# Patient Record
Sex: Male | Born: 1970
Health system: Southern US, Community
[De-identification: ages and names within clinical notes are randomized; demographics above are authoritative.]

## PROBLEM LIST (undated history)

## (undated) DIAGNOSIS — G8929 Other chronic pain: Secondary | ICD-10-CM

## (undated) DIAGNOSIS — M549 Dorsalgia, unspecified: Secondary | ICD-10-CM

## (undated) DIAGNOSIS — G4730 Sleep apnea, unspecified: Secondary | ICD-10-CM

## (undated) DIAGNOSIS — J189 Pneumonia, unspecified organism: Secondary | ICD-10-CM

## (undated) DIAGNOSIS — K219 Gastro-esophageal reflux disease without esophagitis: Secondary | ICD-10-CM

## (undated) DIAGNOSIS — R011 Cardiac murmur, unspecified: Secondary | ICD-10-CM

## (undated) DIAGNOSIS — I370 Nonrheumatic pulmonary valve stenosis: Secondary | ICD-10-CM

## (undated) DIAGNOSIS — U071 COVID-19: Secondary | ICD-10-CM

## (undated) HISTORY — PX: ESOPHAGOGASTRODUODENOSCOPY: SHX1529

## (undated) HISTORY — DX: Nonrheumatic pulmonary valve stenosis: I37.0

## (undated) HISTORY — DX: COVID-19: U07.1

## (undated) HISTORY — PX: EXTENSOR TENDON OF FOREARM / WRIST REPAIR: SHX1547

## (undated) HISTORY — DX: Sleep apnea, unspecified: G47.30

## (undated) HISTORY — DX: Gastro-esophageal reflux disease without esophagitis: K21.9

---

## 2001-11-13 ENCOUNTER — Ambulatory Visit (HOSPITAL_BASED_OUTPATIENT_CLINIC_OR_DEPARTMENT_OTHER): Admission: RE | Admit: 2001-11-13 | Discharge: 2001-11-13 | Payer: Self-pay | Admitting: Orthopedic Surgery

## 2006-09-29 ENCOUNTER — Emergency Department (HOSPITAL_COMMUNITY): Admission: EM | Admit: 2006-09-29 | Discharge: 2006-09-29 | Payer: Self-pay | Admitting: Emergency Medicine

## 2006-10-20 ENCOUNTER — Emergency Department (HOSPITAL_COMMUNITY): Admission: EM | Admit: 2006-10-20 | Discharge: 2006-10-20 | Payer: Self-pay | Admitting: Family Medicine

## 2008-10-13 ENCOUNTER — Encounter: Admission: RE | Admit: 2008-10-13 | Discharge: 2008-10-13 | Payer: Self-pay | Admitting: Family Medicine

## 2010-03-01 ENCOUNTER — Ambulatory Visit: Payer: Self-pay | Admitting: Cardiovascular Disease

## 2010-08-18 NOTE — Op Note (Signed)
NAMEAHMARION, SARACENO NO.:  [id number]   MEDICAL RECORD NO.:  [id number]                   PATIENT TYPE:   LOCATION:                                       FACILITY:   PHYSICIAN:  Nicki Reaper, M.D.                 DATE OF BIRTH:   DATE OF PROCEDURE:  11/13/2001  DATE OF DISCHARGE:                                 OPERATIVE REPORT   PREOPERATIVE DIAGNOSIS:  Ulnar-carpal abutment, right wrist.   POSTOPERATIVE DIAGNOSIS:  Ulnar-carpal abutment, right wrist.   OPERATION:  Arthroscopy; Feldendome arthroplasty, capsular shrinkage, volar  ulnar ligament and scapholunate ligament, partial tear, right wrist.   SURGEON:  Nicki Reaper, M.D.   ASSISTANT:  Joaquin Courts, R.N.   ANESTHESIA:  General.   ANESTHESIOLOGIST:  Maren Beach, M.D.   HISTORY:  The patient is a 40 year old male with a history of ulnar wrist  pain.  MRI reveals TFCC tear.   DESCRIPTION OF PROCEDURE:  The patient was brought to the operating room  where a general anesthetic was carried out without difficulty.  He was  prepped and draped using Betadine scrubbing solution with the right arm  free.  The limb was placed in the arthroscopy tower, 10 pounds of traction  applied, joint inflated through the 3.4 portal with care to protect the EPL  tendon.  The portal was opened with a transverse incision and deepened with  a hemostat.  A blunt trocar was used to enter the joint.  The joint was  inspected, then irrigation catheter was placed in 6-U.  The volar ligaments  were intact.  There was some chondromalacia on the lunate facet and proximal  lunate, partial tear of the scapholunate ligament.  The ulnar side of the  wrist revealed a degenerative tear with protrusion of the ulnar head.  A  small tear of the lunotriquetral ligament was inspected after opening a 5.6  portal; this was localized with a 22-gauge needle.  Again, a transverse  incision was made, deepened with a hemostat and  a blunt trocar used to enter  the joint.  The TFCC was then debrided.  A debridement of synovitis was done  with an Arthrowand.  Mid-carpal joint was then inspected through the mid-  carpal radial portal after inflation, a blunt trocar again used to enter the  joint, the joint inspected.  [facility] showed no changed.  There was mild  instability of the scapholunate but no significant widening occurred.  The  scope was not able to be passed through it.  The lunotriquetral showed no  significant instability.  The proximal capitate and hamate showed no  changes.  The scope was reentered in the proximal carpal joint.  The bur was  then used to remove the ulnar head after removal of the TFCC and after  removal of the cartilage with a Gator shaver.  The edges  were smoothed with  an Arthrowand.  A bur was used to do the Feldendome arthroplasty.  X-rays  confirmed complete resection.  This was confirmed with both inspection from  the 3.4 and 5.6 portals.  The volar ligaments were then shrunk using an  Arthrowand on the ulnar side.  The scapholunate was also shrunk dorsally  with an Arthrowand.  The instruments were removed and the  portals closed with interrupted 5-0 nylon suture.  A sterile compressive  dressing and splint were applied.  The patient tolerated the procedure well  and was taken to the recovery room for observation in satisfactory  condition.   He is discharged home to return to the [facility] in one week  on Vicodin and Keflex.                                               Nicki Reaper, M.D.    GRK/MEDQ  D:  11/13/2001  T:  11/17/2001  Job:  [id number]

## 2011-01-15 LAB — CBC
HCT: 44
Hemoglobin: 15
MCHC: 34
MCV: 87.8
Platelets: 247
RBC: 5.01
RDW: 13
WBC: 5.1

## 2011-01-15 LAB — POCT I-STAT CREATININE
Creatinine, Ser: 0.9
Operator id: 235561

## 2011-01-15 LAB — I-STAT 8, (EC8 V) (CONVERTED LAB)
BUN: 9
Bicarbonate: 23.2
Chloride: 107
Glucose, Bld: 101 — ABNORMAL HIGH
HCT: 46
Hemoglobin: 15.6
Operator id: 235561
Potassium: 4.1
Sodium: 139
TCO2: 24
pCO2, Ven: 34.4 — ABNORMAL LOW
pH, Ven: 7.437 — ABNORMAL HIGH

## 2011-01-15 LAB — POCT H PYLORI SCREEN: H. PYLORI SCREEN, POC: POSITIVE — AB

## 2011-01-15 LAB — HEPATIC FUNCTION PANEL
ALT: 24
AST: 20
Albumin: 4.1
Alkaline Phosphatase: 62
Bilirubin, Direct: <0.1
Total Bilirubin: 0.8
Total Protein: 6.9

## 2011-01-15 LAB — DIFFERENTIAL
Basophils Absolute: 0
Basophils Relative: 1
Eosinophils Absolute: 0.1
Eosinophils Relative: 2
Lymphocytes Relative: 34
Lymphs Abs: 1.8
Monocytes Absolute: 0.5
Monocytes Relative: 10
Neutro Abs: 2.7
Neutrophils Relative %: 53

## 2012-05-21 ENCOUNTER — Other Ambulatory Visit: Payer: Self-pay | Admitting: Gastroenterology

## 2012-05-21 DIAGNOSIS — R1010 Upper abdominal pain, unspecified: Secondary | ICD-10-CM

## 2012-05-22 ENCOUNTER — Ambulatory Visit
Admission: RE | Admit: 2012-05-22 | Discharge: 2012-05-22 | Disposition: A | Discharge status: Home / Self Care | Payer: 59 | Source: Ambulatory Visit | Attending: Gastroenterology | Admitting: Gastroenterology

## 2012-05-22 MED ORDER — IOHEXOL 300 MG/ML  SOLN
125.0000 mL | Freq: Once | INTRAMUSCULAR | Status: AC | PRN
  Administered 2012-05-22: 125 mL via INTRAVENOUS

## 2012-08-10 ENCOUNTER — Ambulatory Visit (INDEPENDENT_AMBULATORY_CARE_PROVIDER_SITE_OTHER): Payer: 59 | Admitting: Family Medicine

## 2012-08-10 VITALS — BP 130/82 | HR 80 | Temp 98.0°F | Resp 16 | Ht 70.0 in | Wt 211.6 lb

## 2012-08-10 DIAGNOSIS — M549 Dorsalgia, unspecified: Secondary | ICD-10-CM

## 2012-08-10 MED ORDER — PREDNISONE 20 MG PO TABS: ORAL_TABLET | ORAL | Status: DC

## 2012-08-10 NOTE — Progress Notes (Signed)
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 This year the pain is at his bed in past years and that he has no sciatica. 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  No weakness no urinary incontinence no sensory loss  Objective: Normal back architecture inspection Mildly positive SLR sitting Reflexes intact Gait stable  Assessment: Lumbar disc disease, no red flags  Plan: Prednisone 40 mg daily x5 with one refill

## 2012-08-10 NOTE — Patient Instructions (Addendum)
Back Pain, Adult Low back pain is very common. About 1 in 5 people have back pain.The cause of low back pain is rarely dangerous. The pain often gets better over time.About half of people with a sudden onset of back pain feel better in just 2 weeks. About 8 in 10 people feel better by 6 weeks.  CAUSES Some common causes of back pain include:  Strain of the muscles or ligaments supporting the spine.  Wear and tear (degeneration) of the spinal discs.  Arthritis.  Direct injury to the back. DIAGNOSIS Most of the time, the direct cause of low back pain is not known.However, back pain can be treated effectively even when the exact cause of the pain is unknown.Answering your caregiver's questions about your overall health and symptoms is one of the most accurate ways to make sure the cause of your pain is not dangerous. If your caregiver needs more information, he or she may order lab work or imaging tests (X-rays or MRIs).However, even if imaging tests show changes in your back, this usually does not require surgery. HOME CARE INSTRUCTIONS For many people, back pain returns.Since low back pain is rarely dangerous, it is often a condition that people can learn to manageon their own.   Remain active. It is stressful on the back to sit or stand in one place. Do not sit, drive, or stand in one place for more than 30 minutes at a time. Take short walks on level surfaces as soon as pain allows.Try to increase the length of time you walk each day.  Do not stay in bed.Resting more than 1 or 2 days can delay your recovery.  Do not avoid exercise or work.Your body is made to move.It is not dangerous to be active, even though your back may hurt.Your back will likely heal faster if you return to being active before your pain is gone.  Pay attention to your body when you bend and lift. Many people have less discomfortwhen lifting if they bend their knees, keep the load close to their bodies,and  avoid twisting. Often, the most comfortable positions are those that put less stress on your recovering back.  Find a comfortable position to sleep. Use a firm mattress and lie on your side with your knees slightly bent. If you lie on your back, put a pillow under your knees.  Only take over-the-counter or prescription medicines as directed by your caregiver. Over-the-counter medicines to reduce pain and inflammation are often the most helpful.Your caregiver may prescribe muscle relaxant drugs.These medicines help dull your pain so you can more quickly return to your normal activities and healthy exercise.  Put ice on the injured area.  Put ice in a plastic bag.  Place a towel between your skin and the bag.  Leave the ice on for 15 to 20 minutes, 3 to 4 times a day for the first 2 to 3 days. After that, ice and heat may be alternated to reduce pain and spasms.  Ask your caregiver about trying back exercises and gentle massage. This may be of some benefit.  Avoid feeling anxious or stressed.Stress increases muscle tension and can worsen back pain.It is important to recognize when you are anxious or stressed and learn ways to manage it.Exercise is a great option. SEEK MEDICAL CARE IF:  You have pain that is not relieved with rest or medicine.  You have pain that does not improve in 1 week.  You have new symptoms.  You are generally   not feeling well. SEEK IMMEDIATE MEDICAL CARE IF:   You have pain that radiates from your back into your legs.  You develop new bowel or bladder control problems.  You have unusual weakness or numbness in your arms or legs.  You develop nausea or vomiting.  You develop abdominal pain.  You feel faint. Document Released: 03/19/2005 Document Revised: 09/18/2011 Document Reviewed: 08/07/2010 Exit[employer] Patient Information 2013 [employer].  

## 2012-09-23 ENCOUNTER — Other Ambulatory Visit: Payer: Self-pay | Admitting: Family Medicine

## 2012-09-23 DIAGNOSIS — M549 Dorsalgia, unspecified: Secondary | ICD-10-CM

## 2012-09-25 ENCOUNTER — Encounter (HOSPITAL_COMMUNITY): Payer: Self-pay | Admitting: Adult Health

## 2012-09-25 ENCOUNTER — Emergency Department (HOSPITAL_COMMUNITY)
Admission: EM | Admit: 2012-09-25 | Discharge: 2012-09-26 | Disposition: A | Discharge status: Home / Self Care | Payer: 59 | Attending: Emergency Medicine | Admitting: Emergency Medicine

## 2012-09-25 DIAGNOSIS — M5416 Radiculopathy, lumbar region: Secondary | ICD-10-CM

## 2012-09-25 DIAGNOSIS — IMO0002 Reserved for concepts with insufficient information to code with codable children: Secondary | ICD-10-CM | POA: Insufficient documentation

## 2012-09-25 DIAGNOSIS — Z79899 Other long term (current) drug therapy: Secondary | ICD-10-CM | POA: Insufficient documentation

## 2012-09-25 NOTE — ED Notes (Signed)
Presents with right buttock pian that radiates into right leg described as sharp. Pt has been seen multiple times this week at the doctor. He is able to ambulate and reports pain is worse when sitting.

## 2012-09-26 ENCOUNTER — Ambulatory Visit
Admission: RE | Admit: 2012-09-26 | Discharge: 2012-09-26 | Disposition: A | Discharge status: Home / Self Care | Payer: 59 | Source: Ambulatory Visit | Attending: Family Medicine | Admitting: Family Medicine

## 2012-09-26 ENCOUNTER — Other Ambulatory Visit: Payer: Self-pay | Admitting: Family Medicine

## 2012-09-26 DIAGNOSIS — M545 Low back pain: Secondary | ICD-10-CM

## 2012-09-26 MED ORDER — OXYCODONE-ACETAMINOPHEN 5-325 MG PO TABS: ORAL_TABLET | ORAL | Status: DC

## 2012-09-26 MED ORDER — MORPHINE SULFATE 4 MG/ML IJ SOLN
6.0000 mg | Freq: Once | INTRAMUSCULAR | Status: AC
  Administered 2012-09-26: 6 mg via INTRAMUSCULAR
  Filled 2012-09-26: qty 2

## 2012-09-26 NOTE — ED Provider Notes (Signed)
History    CSN: [id number] Arrival date & time 09/25/12  2341  First MD Initiated Contact with Patient 09/25/12 2354     Chief Complaint  Patient presents with  . Back Pain   (Consider location/radiation/quality/duration/timing/severity/associated sxs/prior Treatment) HPI  Matthew Lutz is a 42 y.o. male complaining of pain to right buttock that radiates down to the ankle, described as sharp, 10 out of 10, worsening over the course of the week significantly worse today. He denies trauma, change in bladder habits, fever, history of IV drug use or cancer, difficulty ambulating. Patient is not had a bowel movement today he states that this is likely because he has not been eating secondary to pain and also does not want to get up to go to the restroom secondary to pain. States the pain is exacerbated by lying supine. Patient has seen his primary care doctor, an orthopedist and is scheduled for an epidural on Monday. MRI is planned for tomorrow a.m.   History reviewed. No pertinent past medical history. Past Surgical History  Procedure Laterality Date  . Extensor tendon of forearm / wrist repair     Family History  Problem Relation Age of Onset  . Stroke Father     Had Stents placed in.  . Hypertension Father   . Hypertension Mother   . Arrhythmia Sister     Handicapped. Hx of tachcardia. Shocked x2 with defib./CPR   History  Substance Use Topics  . Smoking status: Never Smoker   . Smokeless tobacco: Not on file  . Alcohol Use: No    Review of Systems  Constitutional:       Negative except as described in HPI  HENT:       Negative except as described in HPI  Respiratory:       Negative except as described in HPI  Cardiovascular:       Negative except as described in HPI  Gastrointestinal:       Negative except as described in HPI  Genitourinary:       Negative except as described in HPI  Musculoskeletal:       Negative except as described in HPI  Skin:   Negative except as described in HPI  Neurological:       Negative except as described in HPI  All other systems reviewed and are negative.    Allergies  Review of patient's allergies indicates no known allergies.  Home Medications   Current Outpatient Rx  Name  Route  Sig  Dispense  Refill  . predniSONE (DELTASONE) 20 MG tablet      2 daily with food   10 tablet   1   . tiZANidine (ZANAFLEX) 4 MG tablet   Oral   Take 4 mg by mouth every 6 (six) hours as needed.         . traMADol (ULTRAM) 50 MG tablet   Oral   Take 50 mg by mouth every 6 (six) hours as needed for pain.          BP 138/75  Pulse 68  Temp(Src) 97.6 F (36.4 C) (Oral)  Resp 16  SpO2 98% Physical Exam  Nursing note and vitals reviewed. Constitutional: He is oriented to person, place, and time. He appears well-developed and well-nourished. No distress.  Lying in the left lateral decubitus position, appears acutely uncomfortable.  HENT:  Head: Normocephalic.  Eyes: Conjunctivae and EOM are normal.  Cardiovascular: Normal rate.   Pulmonary/Chest: Effort normal and  breath sounds normal. No stridor.  Musculoskeletal: Normal range of motion.  Straight leg raise is positive on the ipsilateral side at 20 positive on the contralateral side at approximately 40  Neurological: He is alert and oriented to person, place, and time. He displays normal reflexes.  Lower extremity and great toe extension is 5 out of 5 and equal bilaterally. Patellar DTRs 2+ bilaterally  Psychiatric: He has a normal mood and affect.    ED Course  Procedures (including critical care time) Labs Reviewed - No data to display No results found.  1. Lumbar radicular pain     MDM   Filed Vitals:   09/25/12 2349  BP: 138/75  Pulse: 68  Temp: 97.6 F (36.4 C)  TempSrc: Oral  Resp: 16  SpO2: 98%     Matthew Lutz is a 42 y.o. male Patient with back pain.  No neurological deficits and normal neuro exam.  Patient can walk  but states is painful.  No loss of bowel or bladder control.  No concern for cauda equina.  No fever, night sweats, weight loss, h/o cancer, IVDU.  RICE protocol and pain medicine indicated and discussed with patient.   Medications  morphine 4 MG/ML injection 6 mg (not administered)   Pt is hemodynamically stable, appropriate for, and amenable to discharge at this time. Pt verbalized understanding and agrees with care plan. Outpatient follow-up and specific return precautions discussed.    New Prescriptions   OXYCODONE-ACETAMINOPHEN (PERCOCET/ROXICET) 5-325 MG PER TABLET    1 to 2 tabs PO q6hrs  PRN for pain     Wynetta Emery, PA-C 09/26/12 0049

## 2012-09-26 NOTE — ED Provider Notes (Signed)
Medical screening examination/treatment/procedure(s) were performed by non-physician practitioner and as supervising physician I was immediately available for consultation/collaboration.  Sunnie Nielsen, MD 09/26/12 [phone]

## 2012-10-21 ENCOUNTER — Encounter (HOSPITAL_COMMUNITY): Payer: Self-pay | Admitting: Pharmacy Technician

## 2012-10-21 ENCOUNTER — Other Ambulatory Visit (HOSPITAL_COMMUNITY): Payer: Self-pay | Admitting: Orthopaedic Surgery

## 2012-10-23 NOTE — Pre-Procedure Instructions (Signed)
NAS WAFER  10/23/2012   Your procedure is scheduled on:  Wed, July 30 @ 12:30 PM  Report to [facility] at 10:30 AM.  Call this number if you have problems the morning of surgery: [phone]   Remember:   Do not eat food or drink liquids after midnight.   Take these medicines the morning of surgery with A SIP OF WATER: Pain Pill(if needed)               No Aspirin,Goody's,BC's,Aleve,Ibuprofen,Fish Oil,or any Herbal Medications   Do not wear jewelry,             You may wear deodorant.No lotions,powders,or cologne.  Men may shave face and neck.  Do not bring valuables to the hospital.  [facility] is not responsible                   for any belongings or valuables.  Contacts, dentures or bridgework may not be worn into surgery.  Leave suitcase in the car. After surgery it may be brought to your room.  For patients admitted to the hospital, checkout time is 11:00 AM the day of  discharge.   Patients discharged the day of surgery will not be allowed to drive  home.    Special Instructions: Shower using CHG 2 nights before surgery and the night before surgery.  If you shower the day of surgery use CHG.  Use special wash - you have one bottle of CHG for all showers.  You should use approximately 1/3 of the bottle for each shower.   Please read over the following fact sheets that you were given: Pain Booklet, Coughing and Deep Breathing, MRSA Information and Surgical Site Infection Prevention

## 2012-10-24 ENCOUNTER — Encounter (HOSPITAL_COMMUNITY)
Admission: RE | Admit: 2012-10-24 | Discharge: 2012-10-24 | Disposition: A | Discharge status: Home / Self Care | Payer: 59 | Source: Ambulatory Visit | Attending: Orthopaedic Surgery | Admitting: Orthopaedic Surgery

## 2012-10-24 ENCOUNTER — Other Ambulatory Visit (HOSPITAL_COMMUNITY): Payer: Self-pay | Admitting: Orthopaedic Surgery

## 2012-10-24 ENCOUNTER — Encounter (HOSPITAL_COMMUNITY): Payer: Self-pay

## 2012-10-24 DIAGNOSIS — R011 Cardiac murmur, unspecified: Secondary | ICD-10-CM | POA: Insufficient documentation

## 2012-10-24 DIAGNOSIS — Z01818 Encounter for other preprocedural examination: Secondary | ICD-10-CM | POA: Insufficient documentation

## 2012-10-24 DIAGNOSIS — M5126 Other intervertebral disc displacement, lumbar region: Secondary | ICD-10-CM | POA: Insufficient documentation

## 2012-10-24 HISTORY — DX: Other chronic pain: G89.29

## 2012-10-24 HISTORY — DX: Cardiac murmur, unspecified: R01.1

## 2012-10-24 HISTORY — DX: Dorsalgia, unspecified: M54.9

## 2012-10-24 HISTORY — DX: Pneumonia, unspecified organism: J18.9

## 2012-10-24 LAB — COMPREHENSIVE METABOLIC PANEL
ALT: 40 U/L (ref 0–53)
AST: 17 U/L (ref 0–37)
Albumin: 4.2 g/dL (ref 3.5–5.2)
Calcium: 9.8 mg/dL (ref 8.4–10.5)
Chloride: 102 mEq/L (ref 96–112)
Creatinine, Ser: 0.85 mg/dL (ref 0.50–1.35)
Sodium: 139 mEq/L (ref 135–145)
Total Bilirubin: 0.4 mg/dL (ref 0.3–1.2)

## 2012-10-24 LAB — CBC
MCV: 87.7 fL (ref 78.0–100.0)
Platelets: 257 10*3/uL (ref 150–400)
RBC: 4.87 MIL/uL (ref 4.22–5.81)
RDW: 12.8 % (ref 11.5–15.5)
WBC: 6.5 10*3/uL (ref 4.0–10.5)

## 2012-10-24 LAB — SURGICAL PCR SCREEN: MRSA, PCR: NEGATIVE

## 2012-10-24 NOTE — Progress Notes (Signed)
Spoke with Sherrie about PCR positive for staph;to call in Mupirocin to [employer]

## 2012-10-24 NOTE — Progress Notes (Signed)
Pt doesn't have a cardiologist  Denies ever having a stress test/echo/heart cath  Medical Md is in [place] and Dr.Elkins  Denies ekg or cxr in past yr

## 2012-10-28 MED ORDER — CEFAZOLIN SODIUM-DEXTROSE 2-3 GM-% IV SOLR
2.0000 g | INTRAVENOUS | Status: AC
  Administered 2012-10-29: 2 g via INTRAVENOUS
  Filled 2012-10-28: qty 50

## 2012-10-28 NOTE — H&P (Signed)
[facility]   A Division of [employer] [facility]   [address] [place], [place] [address] Telephone: [phone]  Fax: [phone]     PATIENT: Matthew Lutz, Matthew Lutz   MR#: [id number]  DOB: 1971/01/09       CHIEF COMPLAINT:   Lumbar HNP right L5-S1 with radiculopathy.   A 42 year old male referred by Dr. Alvester Morin for severe back pain, right leg pain with numbness and tingling in his foot, cramping in his leg, failure of response to anti-inflammatories.  He has taken Dilaudid 2 mg 1 every 4 hours and he is getting a refill of 40 tablets every 6-8 days, a total of 3 refills.  Prior to that he was on Norco 7.5, Zanaflex temporary relief with Sterapred, he has taken Naprosyn without relief.  He works as a IT sales professional.  He has had a MRI 09/26/2012, it showed central annular tear at L4-5 with broad based posterior disk bulge, adequate central and lateral recess with patency of the foramina.  He had disk protrusion left paracentral to right posterolateral region with compression of the descending right S1 nerve root consistent with his back pain, leg pain.  Compared to previous exam 2010 the compression is more severe and it has progressed.  The patient has not been able to get relief.  He states the pain is severe and at this point he states he is not able to work.  The patient states that he cannot stand the pain.  He would like to proceed with operative diskectomy due to his leg pain and leg weakness.  He has difficulty with toe walking on the right side, no problems with the left.     CURRENT MEDICATIONS:   He is on no other medications.     PAST SURGICAL HISTORY:   He has not had any surgeries.   ALLERGIES:   None.   FAMILY HISTORY:   Noncontributory.    SOCIAL HISTORY:   He is married to his wife Environmental manager.  He has worked as a IT sales professional for 10 years.   He states when he attempts to run the pain is excruciating, back and right leg pain.   PHYSICAL EXAMINATION:   Anterior tib, EHL is strong.  He has positive straight leg raising at 60 degrees, positive popliteal compression test on the right, sciatic notch tenderness on the right, negative on the left.  The patient is alert and oriented, WD, WN, NAD.  Lungs are clear.  Heart:  Regular rate and rhythm.  Abdomen is soft and nontender.     RADIOGRAPHS/TEST:   MRI scan is reviewed with him documenting the disk herniation with nerve root compression.     PLAN:  We discussed options.  He would like to proceed with microdiskectomy.  Procedure discussed.  Outpatient surgery, overnight stay, use of the operative microscope, quoted chance of re-rupture at 5-10%, possible progression of disk degeneration, potential for re-rupture, possible need for fusion, the possibility of progression of other disks at some point in the future although they are not causing any compression.  He has minimal bulge at 4-5.  Risk of infection discussed.  Reoperation, dural tear.  The patient states he would like to proceed.  He can talk it over with his wife and call about scheduling.  The old MRI was reviewed as well with him and progression reviewed.   For additional information please see handwritten notes, reports, orders and prescriptions in this chart.      Mark C. Ophelia Charter,  M.D.    Auto-Authenticated by Veverly Fells. Ophelia Charter, M.D.

## 2012-10-29 ENCOUNTER — Encounter (HOSPITAL_COMMUNITY): Payer: Self-pay | Admitting: Anesthesiology

## 2012-10-29 ENCOUNTER — Ambulatory Visit (HOSPITAL_COMMUNITY): Payer: 59 | Admitting: Anesthesiology

## 2012-10-29 ENCOUNTER — Ambulatory Visit (HOSPITAL_COMMUNITY): Payer: 59

## 2012-10-29 ENCOUNTER — Observation Stay (HOSPITAL_COMMUNITY)
Admission: RE | Admit: 2012-10-29 | Discharge: 2012-10-30 | Disposition: A | Discharge status: Home / Self Care | Payer: 59 | Source: Ambulatory Visit | Attending: Orthopaedic Surgery | Admitting: Orthopaedic Surgery

## 2012-10-29 ENCOUNTER — Encounter (HOSPITAL_COMMUNITY)
Admission: RE | Disposition: A | Discharge status: Home / Self Care | Payer: Self-pay | Source: Ambulatory Visit | Attending: Orthopaedic Surgery

## 2012-10-29 DIAGNOSIS — M5126 Other intervertebral disc displacement, lumbar region: Principal | ICD-10-CM | POA: Diagnosis present

## 2012-10-29 HISTORY — PX: LUMBAR LAMINECTOMY: SHX95

## 2012-10-29 SURGERY — MICRODISCECTOMY LUMBAR LAMINECTOMY
Anesthesia: General | Site: Spine Lumbar | Wound class: Clean

## 2012-10-29 MED ORDER — METHOCARBAMOL 500 MG PO TABS: 500.0000 mg | ORAL_TABLET | Freq: Four times a day (QID) | ORAL | Status: DC | PRN

## 2012-10-29 MED ORDER — HYDROMORPHONE HCL PF 1 MG/ML IJ SOLN
INTRAMUSCULAR | Status: DC | PRN
  Administered 2012-10-29 (×4): 0.5 mg via INTRAVENOUS

## 2012-10-29 MED ORDER — SENNOSIDES-DOCUSATE SODIUM 8.6-50 MG PO TABS: 1.0000 | ORAL_TABLET | Freq: Every evening | ORAL | Status: DC | PRN

## 2012-10-29 MED ORDER — DOCUSATE SODIUM 100 MG PO CAPS
100.0000 mg | ORAL_CAPSULE | Freq: Two times a day (BID) | ORAL | Status: DC
  Administered 2012-10-29: 100 mg via ORAL
  Filled 2012-10-29 (×2): qty 1

## 2012-10-29 MED ORDER — ACETAMINOPHEN 325 MG PO TABS: 650.0000 mg | ORAL_TABLET | ORAL | Status: DC | PRN

## 2012-10-29 MED ORDER — KCL IN DEXTROSE-NACL 20-5-0.45 MEQ/L-%-% IV SOLN
INTRAVENOUS | Status: DC
  Administered 2012-10-29: 21:00:00 via INTRAVENOUS
  Filled 2012-10-29 (×3): qty 1000

## 2012-10-29 MED ORDER — HYDROMORPHONE HCL PF 1 MG/ML IJ SOLN: 0.2500 mg | INTRAMUSCULAR | Status: DC | PRN

## 2012-10-29 MED ORDER — LACTATED RINGERS IV SOLN
INTRAVENOUS | Status: DC
  Administered 2012-10-29 (×2): via INTRAVENOUS

## 2012-10-29 MED ORDER — SODIUM CHLORIDE 0.9 % IJ SOLN
3.0000 mL | Freq: Two times a day (BID) | INTRAMUSCULAR | Status: DC
  Administered 2012-10-29: 3 mL via INTRAVENOUS

## 2012-10-29 MED ORDER — GLYCOPYRROLATE 0.2 MG/ML IJ SOLN
INTRAMUSCULAR | Status: DC | PRN
  Administered 2012-10-29: .8 mg via INTRAVENOUS

## 2012-10-29 MED ORDER — LIDOCAINE HCL 4 % MT SOLN
OROMUCOSAL | Status: DC | PRN
  Administered 2012-10-29: 4 mL via TOPICAL

## 2012-10-29 MED ORDER — FLEET ENEMA 7-19 GM/118ML RE ENEM: 1.0000 | ENEMA | Freq: Once | RECTAL | Status: AC | PRN

## 2012-10-29 MED ORDER — METHOCARBAMOL 100 MG/ML IJ SOLN
500.0000 mg | Freq: Four times a day (QID) | INTRAVENOUS | Status: DC | PRN
  Filled 2012-10-29: qty 5

## 2012-10-29 MED ORDER — BUPIVACAINE HCL (PF) 0.25 % IJ SOLN
INTRAMUSCULAR | Status: DC | PRN
  Administered 2012-10-29: 15 mL

## 2012-10-29 MED ORDER — MEPERIDINE HCL 25 MG/ML IJ SOLN: 6.2500 mg | INTRAMUSCULAR | Status: DC | PRN

## 2012-10-29 MED ORDER — ACETAMINOPHEN 650 MG RE SUPP: 650.0000 mg | RECTAL | Status: DC | PRN

## 2012-10-29 MED ORDER — MIDAZOLAM HCL 5 MG/5ML IJ SOLN
INTRAMUSCULAR | Status: DC | PRN
  Administered 2012-10-29: 2 mg via INTRAVENOUS

## 2012-10-29 MED ORDER — ONDANSETRON HCL 4 MG/2ML IJ SOLN: 4.0000 mg | INTRAMUSCULAR | Status: DC | PRN

## 2012-10-29 MED ORDER — FENTANYL CITRATE 0.05 MG/ML IJ SOLN
INTRAMUSCULAR | Status: DC | PRN
  Administered 2012-10-29 (×2): 50 ug via INTRAVENOUS
  Administered 2012-10-29: 100 ug via INTRAVENOUS
  Administered 2012-10-29: 50 ug via INTRAVENOUS
  Administered 2012-10-29: 100 ug via INTRAVENOUS
  Administered 2012-10-29: 150 ug via INTRAVENOUS
  Administered 2012-10-29: 100 ug via INTRAVENOUS

## 2012-10-29 MED ORDER — THROMBIN 20000 UNITS EX SOLR
CUTANEOUS | Status: AC
  Filled 2012-10-29: qty 20000

## 2012-10-29 MED ORDER — OXYCODONE HCL 5 MG PO TABS: 5.0000 mg | ORAL_TABLET | Freq: Once | ORAL | Status: DC | PRN

## 2012-10-29 MED ORDER — NEOSTIGMINE METHYLSULFATE 1 MG/ML IJ SOLN
INTRAMUSCULAR | Status: DC | PRN
  Administered 2012-10-29: 5 mg via INTRAVENOUS

## 2012-10-29 MED ORDER — HYDROCODONE-ACETAMINOPHEN 5-325 MG PO TABS: 1.0000 | ORAL_TABLET | ORAL | Status: DC | PRN

## 2012-10-29 MED ORDER — METHOCARBAMOL 500 MG PO TABS
500.0000 mg | ORAL_TABLET | Freq: Four times a day (QID) | ORAL | Status: DC | PRN
  Administered 2012-10-29 – 2012-10-30 (×2): 500 mg via ORAL
  Filled 2012-10-29 (×2): qty 1

## 2012-10-29 MED ORDER — PROPOFOL 10 MG/ML IV BOLUS
INTRAVENOUS | Status: DC | PRN
  Administered 2012-10-29: 200 mg via INTRAVENOUS

## 2012-10-29 MED ORDER — SODIUM CHLORIDE 0.9 % IJ SOLN: 3.0000 mL | INTRAMUSCULAR | Status: DC | PRN

## 2012-10-29 MED ORDER — BUPIVACAINE HCL (PF) 0.25 % IJ SOLN
INTRAMUSCULAR | Status: AC
  Filled 2012-10-29: qty 30

## 2012-10-29 MED ORDER — HYDROMORPHONE HCL PF 1 MG/ML IJ SOLN
1.0000 mg | INTRAMUSCULAR | Status: DC | PRN
  Administered 2012-10-29 (×2): 1 mg via INTRAVENOUS
  Filled 2012-10-29 (×2): qty 1

## 2012-10-29 MED ORDER — BISACODYL 10 MG RE SUPP: 10.0000 mg | Freq: Every day | RECTAL | Status: DC | PRN

## 2012-10-29 MED ORDER — KETOROLAC TROMETHAMINE 30 MG/ML IJ SOLN
30.0000 mg | Freq: Four times a day (QID) | INTRAMUSCULAR | Status: DC
  Administered 2012-10-29 – 2012-10-30 (×3): 30 mg via INTRAVENOUS
  Filled 2012-10-29 (×4): qty 1

## 2012-10-29 MED ORDER — PROMETHAZINE HCL 25 MG/ML IJ SOLN: 6.2500 mg | INTRAMUSCULAR | Status: DC | PRN

## 2012-10-29 MED ORDER — DEXAMETHASONE SODIUM PHOSPHATE 4 MG/ML IJ SOLN
INTRAMUSCULAR | Status: DC | PRN
  Administered 2012-10-29: 4 mg via INTRAVENOUS

## 2012-10-29 MED ORDER — ARTIFICIAL TEARS OP OINT
TOPICAL_OINTMENT | OPHTHALMIC | Status: DC | PRN
  Administered 2012-10-29: 1 via OPHTHALMIC

## 2012-10-29 MED ORDER — ZOLPIDEM TARTRATE 5 MG PO TABS: 5.0000 mg | ORAL_TABLET | Freq: Every evening | ORAL | Status: DC | PRN

## 2012-10-29 MED ORDER — 0.9 % SODIUM CHLORIDE (POUR BTL) OPTIME
TOPICAL | Status: DC | PRN
  Administered 2012-10-29: 1000 mL

## 2012-10-29 MED ORDER — MENTHOL 3 MG MT LOZG: 1.0000 | LOZENGE | OROMUCOSAL | Status: DC | PRN

## 2012-10-29 MED ORDER — LIDOCAINE HCL (CARDIAC) 20 MG/ML IV SOLN
INTRAVENOUS | Status: DC | PRN
  Administered 2012-10-29: 30 mg via INTRAVENOUS

## 2012-10-29 MED ORDER — SODIUM CHLORIDE 0.9 % IV SOLN: 250.0000 mL | INTRAVENOUS | Status: DC

## 2012-10-29 MED ORDER — ONDANSETRON HCL 4 MG/2ML IJ SOLN
INTRAMUSCULAR | Status: DC | PRN
  Administered 2012-10-29: 4 mg via INTRAVENOUS

## 2012-10-29 MED ORDER — OXYCODONE-ACETAMINOPHEN 5-325 MG PO TABS: 1.0000 | ORAL_TABLET | ORAL | Status: DC | PRN

## 2012-10-29 MED ORDER — OXYCODONE HCL 5 MG/5ML PO SOLN: 5.0000 mg | Freq: Once | ORAL | Status: DC | PRN

## 2012-10-29 MED ORDER — PANTOPRAZOLE SODIUM 40 MG IV SOLR
40.0000 mg | Freq: Every day | INTRAVENOUS | Status: DC
  Administered 2012-10-29: 40 mg via INTRAVENOUS
  Filled 2012-10-29 (×2): qty 40

## 2012-10-29 MED ORDER — OXYCODONE-ACETAMINOPHEN 5-325 MG PO TABS
1.0000 | ORAL_TABLET | ORAL | Status: DC | PRN
  Administered 2012-10-29 – 2012-10-30 (×3): 2 via ORAL
  Filled 2012-10-29 (×3): qty 2

## 2012-10-29 MED ORDER — ROCURONIUM BROMIDE 100 MG/10ML IV SOLN
INTRAVENOUS | Status: DC | PRN
  Administered 2012-10-29: 50 mg via INTRAVENOUS
  Administered 2012-10-29: 10 mg via INTRAVENOUS

## 2012-10-29 MED ORDER — MIDAZOLAM HCL 2 MG/2ML IJ SOLN: 0.5000 mg | Freq: Once | INTRAMUSCULAR | Status: DC | PRN

## 2012-10-29 MED ORDER — PHENOL 1.4 % MT LIQD: 1.0000 | OROMUCOSAL | Status: DC | PRN

## 2012-10-29 MED ORDER — HYDROMORPHONE HCL PF 1 MG/ML IJ SOLN
INTRAMUSCULAR | Status: AC
  Filled 2012-10-29: qty 1

## 2012-10-29 SURGICAL SUPPLY — 51 items
BUR ROUND FLUTED 4 SOFT TCH (BURR) IMPLANT
CLOTH BEACON ORANGE TIMEOUT ST (SAFETY) ×2 IMPLANT
CORDS BIPOLAR (ELECTRODE) ×2 IMPLANT
COVER SURGICAL LIGHT HANDLE (MISCELLANEOUS) ×2 IMPLANT
DERMABOND ADVANCED (GAUZE/BANDAGES/DRESSINGS) ×1
DERMABOND ADVANCED .7 DNX12 (GAUZE/BANDAGES/DRESSINGS) ×1 IMPLANT
DRAPE MICROSCOPE LEICA (MISCELLANEOUS) ×2 IMPLANT
DRAPE PROXIMA HALF (DRAPES) ×4 IMPLANT
DRSG EMULSION OIL 3X3 NADH (GAUZE/BANDAGES/DRESSINGS) ×2 IMPLANT
DRSG MEPILEX BORDER 4X4 (GAUZE/BANDAGES/DRESSINGS) ×2 IMPLANT
DRSG MEPILEX BORDER 4X8 (GAUZE/BANDAGES/DRESSINGS) ×2 IMPLANT
DURAPREP 26ML APPLICATOR (WOUND CARE) ×2 IMPLANT
ELECT REM PT RETURN 9FT ADLT (ELECTROSURGICAL) ×2
ELECTRODE REM PT RTRN 9FT ADLT (ELECTROSURGICAL) ×1 IMPLANT
GLOVE BIOGEL PI IND STRL 7.5 (GLOVE) ×1 IMPLANT
GLOVE BIOGEL PI IND STRL 8 (GLOVE) ×1 IMPLANT
GLOVE BIOGEL PI INDICATOR 7.5 (GLOVE) ×1
GLOVE BIOGEL PI INDICATOR 8 (GLOVE) ×1
GLOVE ECLIPSE 7.0 STRL STRAW (GLOVE) ×2 IMPLANT
GLOVE ORTHO TXT STRL SZ7.5 (GLOVE) ×2 IMPLANT
GOWN PREVENTION PLUS LG XLONG (DISPOSABLE) IMPLANT
GOWN STRL NON-REIN LRG LVL3 (GOWN DISPOSABLE) ×6 IMPLANT
KIT BASIN OR (CUSTOM PROCEDURE TRAY) ×2 IMPLANT
KIT ROOM TURNOVER OR (KITS) ×2 IMPLANT
MANIFOLD NEPTUNE II (INSTRUMENTS) ×2 IMPLANT
NDL SUT .5 MAYO 1.404X.05X (NEEDLE) ×1 IMPLANT
NEEDLE 22X1 1/2 (OR ONLY) (NEEDLE) ×2 IMPLANT
NEEDLE HYPO 25GX1X1/2 BEV (NEEDLE) ×2 IMPLANT
NEEDLE MAYO TAPER (NEEDLE) ×1
NEEDLE SPNL 18GX3.5 QUINCKE PK (NEEDLE) ×2 IMPLANT
NS IRRIG 1000ML POUR BTL (IV SOLUTION) ×2 IMPLANT
PACK LAMINECTOMY ORTHO (CUSTOM PROCEDURE TRAY) ×2 IMPLANT
PAD ARMBOARD 7.5X6 YLW CONV (MISCELLANEOUS) ×4 IMPLANT
PATTIES SURGICAL .5 X.5 (GAUZE/BANDAGES/DRESSINGS) ×2 IMPLANT
PATTIES SURGICAL .75X.75 (GAUZE/BANDAGES/DRESSINGS) IMPLANT
SPONGE GAUZE 4X4 12PLY (GAUZE/BANDAGES/DRESSINGS) ×2 IMPLANT
SUT MON AB 4-0 RB1 27 (SUTURE) ×2 IMPLANT
SUT VIC AB 0 CT1 27 (SUTURE) ×1
SUT VIC AB 0 CT1 27XBRD ANBCTR (SUTURE) ×1 IMPLANT
SUT VIC AB 2-0 CT1 27 (SUTURE) ×1
SUT VIC AB 2-0 CT1 TAPERPNT 27 (SUTURE) ×1 IMPLANT
SUT VIC AB 4-0 RB1 27 (SUTURE) ×1
SUT VIC AB 4-0 RB1 27X BRD (SUTURE) ×1 IMPLANT
SUT VICRYL 0 TIES 12 18 (SUTURE) ×2 IMPLANT
SUT VICRYL 4-0 PS2 18IN ABS (SUTURE) IMPLANT
SUT VICRYL AB 2 0 TIES (SUTURE) ×2 IMPLANT
SYR 20ML ECCENTRIC (SYRINGE) IMPLANT
SYR CONTROL 10ML LL (SYRINGE) ×2 IMPLANT
TOWEL OR 17X24 6PK STRL BLUE (TOWEL DISPOSABLE) ×2 IMPLANT
TOWEL OR 17X26 10 PK STRL BLUE (TOWEL DISPOSABLE) ×2 IMPLANT
WATER STERILE IRR 1000ML POUR (IV SOLUTION) ×2 IMPLANT

## 2012-10-29 NOTE — Anesthesia Procedure Notes (Signed)
Procedure Name: Intubation Date/Time: 10/29/2012 12:40 PM Performed by: Leona Singleton A Pre-anesthesia Checklist: Patient identified, Emergency Drugs available, Suction available and Patient being monitored Patient Re-evaluated:Patient Re-evaluated prior to inductionOxygen Delivery Method: Circle system utilized Preoxygenation: Pre-oxygenation with 100% oxygen Intubation Type: IV induction Ventilation: Mask ventilation without difficulty Laryngoscope Size: Miller and 2 Grade View: Grade I Tube type: Oral Tube size: 7.5 mm Number of attempts: 1 Airway Equipment and Method: Stylet and LTA kit utilized Placement Confirmation: ETT inserted through vocal cords under direct vision,  positive ETCO2 and breath sounds checked- equal and bilateral Secured at: 23 cm Tube secured with: Tape Dental Injury: Teeth and Oropharynx as per pre-operative assessment

## 2012-10-29 NOTE — Brief Op Note (Cosign Needed)
10/29/2012  1:52 PM  PATIENT:  Matthew Lutz  42 y.o. male  PRE-OPERATIVE DIAGNOSIS:  Right L5-S1 herniated nucleus pulposus  POST-OPERATIVE DIAGNOSIS:  Right L5-S1 herniated nucleus pulposus  PROCEDURE:  Procedure(s): RIGHT L5-S1 MICRODISCECTOMY (N/A)  SURGEON:  Surgeon(s) and Role:    * Eldred Manges, MD - Primary  PHYSICIAN ASSISTANT: Maud Deed PAC  ASSISTANTS: none   ANESTHESIA:   general  EBL:  Total I/O In: 1000 [I.V.:1000] Out: 50 [Blood:50]  BLOOD ADMINISTERED:none  DRAINS: none   LOCAL MEDICATIONS USED:  MARCAINE     SPECIMEN:  No Specimen  DISPOSITION OF SPECIMEN:  N/A  COUNTS:  YES  TOURNIQUET:  * No tourniquets in log *  DICTATION: .Note written in EPIC  PLAN OF CARE: Admit for overnight observation  PATIENT DISPOSITION:  PACU - hemodynamically stable.   Delay start of Pharmacological VTE agent (>24hrs) due to surgical blood loss or risk of bleeding: yes

## 2012-10-29 NOTE — Interval H&P Note (Signed)
History and Physical Interval Note:  10/29/2012 12:15 PM  Matthew Lutz  has presented today for surgery, with the diagnosis of Right L5-S1 herniated nucleus pulposus  The various methods of treatment have been discussed with the patient and family. After consideration of risks, benefits and other options for treatment, the patient has consented to  Procedure(s): RIGHT L5-S1 MICRODISCECTOMY (N/A) as a surgical intervention .  The patient's history has been reviewed, patient examined, no change in status, stable for surgery.  I have reviewed the patient's chart and labs.  Questions were answered to the patient's satisfaction.     Marquavius Scaife C

## 2012-10-29 NOTE — Anesthesia Preprocedure Evaluation (Addendum)
Anesthesia Evaluation  Patient identified by MRN, date of birth, ID band Patient awake    Reviewed: Allergy & Precautions, H&P , NPO status , Patient's Chart, lab work & pertinent test results  History of Anesthesia Complications Negative for: history of anesthetic complications  Airway Mallampati: II TM Distance: >3 FB Neck ROM: Full    Dental  (+) Teeth Intact and Dental Advisory Given   Pulmonary neg pulmonary ROS,  breath sounds clear to auscultation  Pulmonary exam normal       Cardiovascular negative cardio ROS  Rate:Normal     Neuro/Psych Back pain    GI/Hepatic negative GI ROS, Neg liver ROS,   Endo/Other  negative endocrine ROS  Renal/GU negative Renal ROS  negative genitourinary   Musculoskeletal   Abdominal   Peds  Hematology negative hematology ROS (+)   Anesthesia Other Findings   Reproductive/Obstetrics                          Anesthesia Physical Anesthesia Plan  ASA: II  Anesthesia Plan: General   Post-op Pain Management:    Induction: Intravenous  Airway Management Planned: Oral ETT  Additional Equipment:   Intra-op Plan:   Post-operative Plan: Extubation in OR  Informed Consent: I have reviewed the patients History and Physical, chart, labs and discussed the procedure including the risks, benefits and alternatives for the proposed anesthesia with the patient or authorized representative who has indicated his/her understanding and acceptance.   Dental advisory given  Plan Discussed with: Surgeon and CRNA  Anesthesia Plan Comments: (Plan routine monitors, GETA)        Anesthesia Quick Evaluation

## 2012-10-29 NOTE — Transfer of Care (Signed)
Immediate Anesthesia Transfer of Care Note  Patient: Matthew Lutz  Procedure(s) Performed: Procedure(s): RIGHT L5-S1 MICRODISCECTOMY (N/A)  Patient Location: PACU  Anesthesia Type:General  Level of Consciousness: awake, alert  and oriented  Airway & Oxygen Therapy: Patient Spontanous Breathing and Patient connected to nasal cannula oxygen  Post-op Assessment: Report given to PACU RN, Post -op Vital signs reviewed and stable and Patient moving all extremities  Post vital signs: Reviewed and stable  Complications: No apparent anesthesia complications

## 2012-10-29 NOTE — Progress Notes (Signed)
#  18g IV catheter inserted in left hand by C.Moshe Salisbury. Site unremarkable,pt. Tolerated with problems.

## 2012-10-29 NOTE — Preoperative (Signed)
Beta Blockers   Reason not to administer Beta Blockers:Not Applicable 

## 2012-10-30 NOTE — Anesthesia Postprocedure Evaluation (Signed)
  Anesthesia Post-op Note  Patient: Matthew Lutz  Procedure(s) Performed: Procedure(s): RIGHT L5-S1 MICRODISCECTOMY (N/A)  No apparent anesthetic complications

## 2012-10-30 NOTE — Progress Notes (Signed)
UR COMPLETED  

## 2012-10-30 NOTE — Op Note (Signed)
NAMEJULEN, RUBERT NO.:  [id number]  MEDICAL RECORD NO.:  [id number]  LOCATION:  5N26C                        FACILITY:  [facility]  PHYSICIAN:  Dametrius Sanjuan C. Ophelia Charter, M.D.    DATE OF BIRTH:  10/18/1970  DATE OF PROCEDURE:  10/29/2012 DATE OF DISCHARGE:                              OPERATIVE REPORT   PREOPERATIVE DIAGNOSIS:  Right L5-S1 herniated nucleus pulposus.  POSTOPERATIVE DIAGNOSIS:  Right L5-S1 herniated nucleus pulposus.  PROCEDURE:  Right L5-S1 microdiskectomy.  SURGEON:  Latana Colin C. Ophelia Charter, MD  ASSISTANT:  Maud Deed, PA-C, medically necessary and present for the entire procedure.  ANESTHESIA:  GOT plus Marcaine skin local.  COMPLICATIONS:  None.  INDICATIONS:  This 42 year old male who has had persistent problems with L5-S1 right HNP with nerve root compression, persistent radicular symptoms, failed conservative treatment, physical therapy with progressive leg weakness, and failed epidural steroids.  DESCRIPTION OF PROCEDURE:  After induction of general anesthesia, informed consent, patient was placed prone.  Back was prepped with DuraPrep.  As there was drying, time-out procedure was completed.  It was squared with towels, Betadine.  Steri-Drape applied and laminectomy sheet.  Needle initially placed at 5-1 level.  Palpable landmarks in this normal size individual __________.  After marking with a skin marker, a small incision was made between the L5 and S1 spinous process on the right side a couple of millimeter to 4 mm off the midline. Subperiosteal dissection, Taylor retractor placed.  The sacrum was palpated and a #4 Penfield was placed in the interlaminar gap between L5 and S1, confirmed the lateral lumbar plain radiograph.  Laminotomy was performed.  Operative microscope was draped and brought in.  The ligamentum was incised with Cushing pickups and 15-scalpel blade. Pattie was used to protect the dura.  The 3 mm Kerrison was removed with the  chunks of ligament.  Dura was visualized.  Some veins in the lateral gutter were coagulated with bipolar cautery.  Nerve root was gently elevated and disk bulge was immediately noted.  'D'Errico was used to protect the nerve root, and using the tip of the [place], a small filmy area which encased the disk herniation was popped open and immediately a large piece of disk was removed in a single chunk.  Disk space was narrowed with greater than 50% collapse of the disk space on both MRI and plain radiograph.  Endplate had some irregularity and regular pituitary would not fit into the disk space initially.  Micropituitary up and down Epstein curettes were used to push some additional disk down into the middle of the disk and then remove it.  Hockey stick was able to be swept 180 degrees anterior to the dura with no areas of compression.  Foramina was enlarged.  Shoulder of the nerve root was checked.  Palpation of the nerve root showed no areas of compression. Bone was removed out to the level of the pedicle to make sure the nerve root was free and again checking anterior to the nerve root to make sure that there was no residual disk fragment.  The filmy sac that disk fragment then was well visualized.  After final passes of irrigation, operative field was  dry.  Deep fascia was closed with 0 Vicryl, 2-0 Vicryl subcutaneous tissue, 4-0 Vicryl subcuticular closure, Dermabond, and the skin postop dressing, Marcaine infiltration, and transferred to recovery room in stable condition. Instrument count and needle count was correct.     Jager Koska C. Ophelia Charter, M.D.     MCY/MEDQ  D:  10/29/2012  T:  10/30/2012  Job:  [id number]

## 2012-10-31 ENCOUNTER — Encounter (HOSPITAL_COMMUNITY): Payer: Self-pay | Admitting: Orthopaedic Surgery

## 2012-11-10 NOTE — Discharge Summary (Signed)
Physician Discharge Summary  Patient ID: Matthew Lutz MRN: [id number] DOB/AGE: 42-Jul-1972 42 y.o.  Admit date: 10/29/2012 Discharge date: 10/30/2012  Admission Diagnoses:  HNP (herniated nucleus pulposus), lumbar  Discharge Diagnoses:  Principal Problem:   HNP (herniated nucleus pulposus), lumbar   Past Medical History  Diagnosis Date  . Heart murmur   . Pneumonia     hx of;couple of yrs ago  . Chronic back pain     HNP    Surgeries: Procedure(s): RIGHT L5-S1 MICRODISCECTOMY on 10/29/2012   Consultants (if any):  none  Discharged Condition: Improved  Hospital Course: Matthew Lutz is an 42 y.o. male who was admitted 10/29/2012 with a diagnosis of HNP (herniated nucleus pulposus), lumbar and went to the operating room on 10/29/2012 and underwent the above named procedures.    He was given perioperative antibiotics:  Anti-infectives   Start     Dose/Rate Route Frequency Ordered Stop   10/29/12 0600  ceFAZolin (ANCEF) IVPB 2 g/50 mL premix     2 g 100 mL/hr over 30 Minutes Intravenous On call to O.R. 10/28/12 1448 10/29/12 1237    .  He was given sequential compression devices, early ambulation for DVT prophylaxis.  He benefited maximally from the hospital stay and there were no complications.    Recent vital signs:  Filed Vitals:   10/30/12 0548  BP: 92/55  Pulse: 74  Temp: 97.8 F (36.6 C)  Resp: 16    Recent laboratory studies:  Lab Results  Component Value Date   HGB 15.1 10/24/2012   HGB 15.6 10/20/2006   HGB 15.0 10/20/2006   Lab Results  Component Value Date   WBC 6.5 10/24/2012   PLT 257 10/24/2012   No results found for this basename: INR   Lab Results  Component Value Date   NA 139 10/24/2012   K 4.3 10/24/2012   CL 102 10/24/2012   CO2 27 10/24/2012   BUN 14 10/24/2012   CREATININE 0.85 10/24/2012   GLUCOSE 105* 10/24/2012    Discharge Medications:     Medication List    STOP taking these medications       HYDROmorphone 4 MG tablet   Commonly known as:  DILAUDID      TAKE these medications       methocarbamol 500 MG tablet  Commonly known as:  ROBAXIN  Take 1 tablet (500 mg total) by mouth every 6 (six) hours as needed (spasm).     oxyCODONE-acetaminophen 5-325 MG per tablet  Commonly known as:  ROXICET  Take 1-2 tablets by mouth every 4 (four) hours as needed for pain.        Diagnostic Studies: Dg Chest 2 View  10/24/2012   *RADIOLOGY REPORT*  Clinical Data: Preop lumbar surgery.  Heart murmur.  CHEST - 2 VIEW  Comparison: 09/29/2006  Findings: Heart and mediastinal contours are within normal limits. No focal opacities or effusions.  No acute bony abnormality.  IMPRESSION: No active cardiopulmonary disease.   Original Report Authenticated By: Charlett Nose, M.D.   Dg Lumbar Spine 2-3 Views  10/29/2012   *RADIOLOGY REPORT*  Clinical Data: Right L5-S1 microdiskectomy  LUMBAR SPINE - 2-3 VIEW  Comparison: Lumbar spine MRI dated 09/26/2012  Findings: Five lumbar-type vertebral bodies.  Straightening of lumbar spine.  Mild multilevel degenerative changes, most prominent at L5-S1.  No evidence of fracture dislocation. Vertebral body heights are maintained.  IMPRESSION: Mild multilevel degenerative changes, most prominent at L5-1.   Original Report Authenticated  By: Charline Bills, M.D.   Dg Lumbar Spine 1 View  10/29/2012   *RADIOLOGY REPORT*  Clinical Data: L5-S1 disc protrusion.  LUMBAR SPINE - 1 VIEW  Comparison: MRI dated 09/26/2012  Findings: Single lateral view demonstrates instruments at the L5-S1 level.  IMPRESSION: Instruments at L5-S1.   Original Report Authenticated By: Francene Boyers, M.D.    Disposition: 01-Home or Self Care No lifting greater than 10 lbs. Avoid bending, stooping and twisting. Walk in house for first week them may start to get out slowly increasing distance up to one mile by 3 weeks post op. Keep incision dry for 3 days, may use tegaderm or similar water impervious dressing. Change  dressing daily or as needed. Ice packs to back as needed for pain and swelling       Follow-up Information   Follow up with YATES,MARK C, MD. Schedule an appointment as soon as possible for a visit in 2 weeks.   Contact information:   [address] [place] [address] [phone]        Signed: Wende Neighbors 11/10/2012, 10:29 AM

## 2014-06-01 IMAGING — DX DG LUMBAR SPINE 1V
1 series · 1 of 1 positions shown · non-contrast
Comparison: MRI dated 09/26/2012

CLINICAL DATA: L5-S1 disc protrusion.

LUMBAR SPINE - 1 VIEW

[lat]
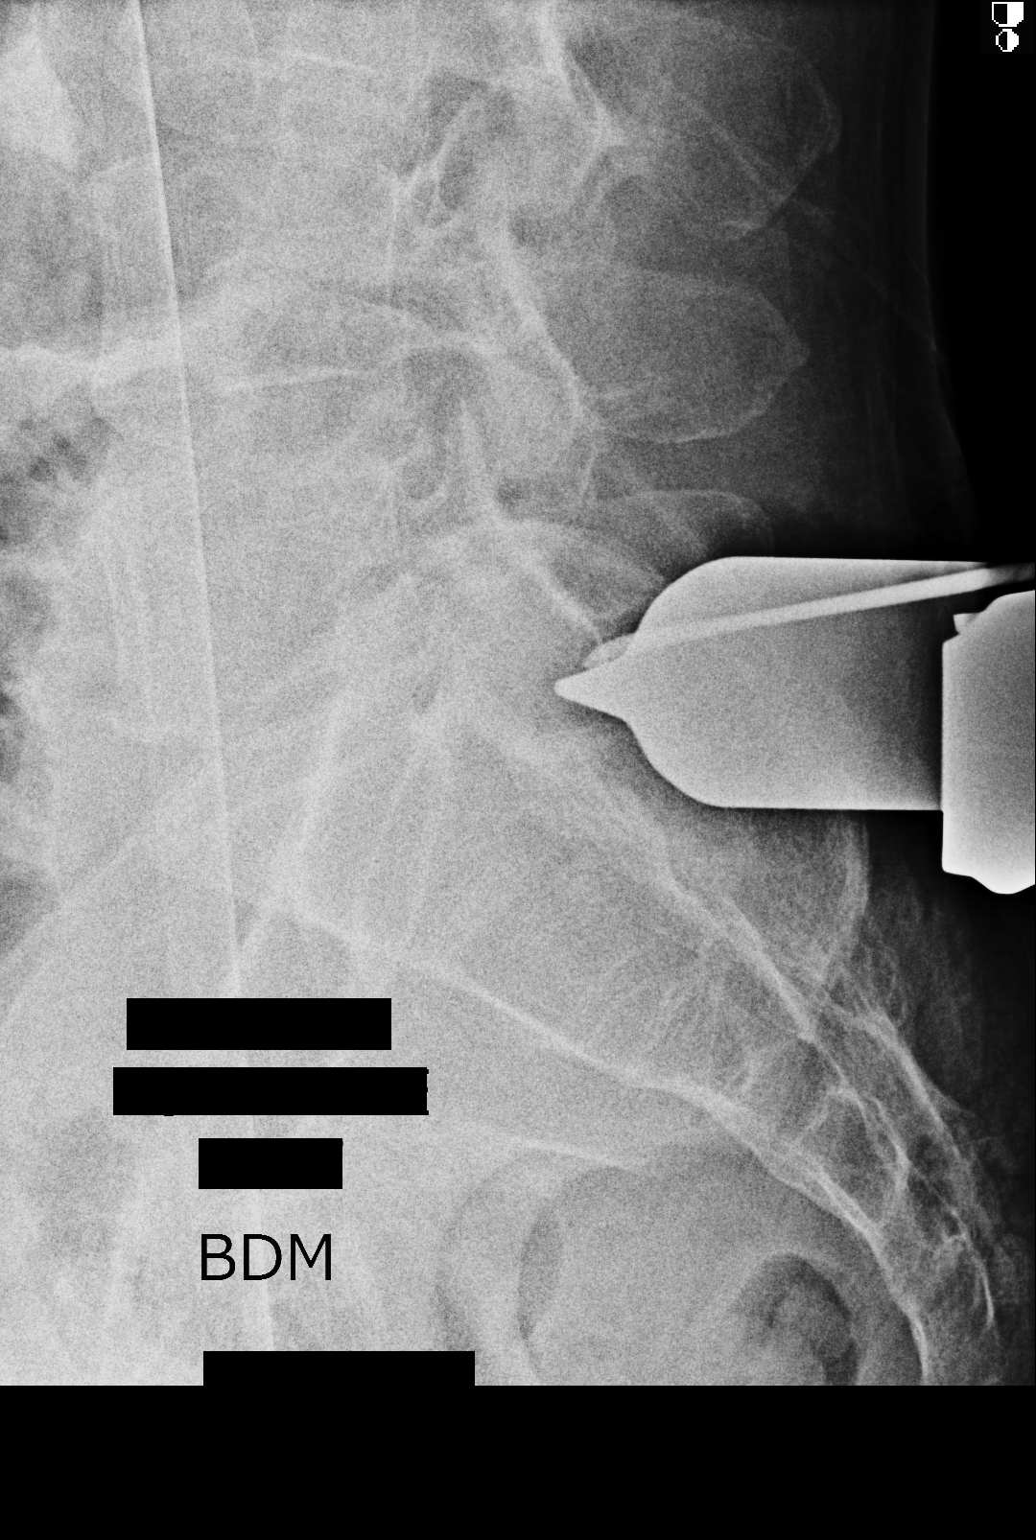

[1 of 1 positions shown; findings below may reference images not displayed]

FINDINGS: Single lateral view demonstrates instruments at the L5-S1
level.
IMPRESSION: Instruments at L5-S1.

## 2015-09-28 ENCOUNTER — Other Ambulatory Visit: Payer: Self-pay | Admitting: Orthopedic Surgery

## 2015-09-28 DIAGNOSIS — M25512 Pain in left shoulder: Secondary | ICD-10-CM

## 2015-10-06 ENCOUNTER — Ambulatory Visit
Admission: RE | Admit: 2015-10-06 | Discharge: 2015-10-06 | Disposition: A | Discharge status: Home / Self Care | Payer: 59 | Source: Ambulatory Visit | Attending: Orthopedic Surgery | Admitting: Orthopedic Surgery

## 2015-10-06 DIAGNOSIS — M25512 Pain in left shoulder: Secondary | ICD-10-CM

## 2016-07-23 ENCOUNTER — Other Ambulatory Visit: Payer: Self-pay | Admitting: Emergency Medicine

## 2016-07-23 DIAGNOSIS — R011 Cardiac murmur, unspecified: Secondary | ICD-10-CM

## 2016-08-02 ENCOUNTER — Other Ambulatory Visit (HOSPITAL_COMMUNITY): Payer: Self-pay

## 2016-08-02 ENCOUNTER — Other Ambulatory Visit: Payer: Self-pay

## 2016-08-02 ENCOUNTER — Ambulatory Visit (HOSPITAL_COMMUNITY): Payer: 59 | Attending: Cardiology

## 2016-08-02 DIAGNOSIS — R011 Cardiac murmur, unspecified: Secondary | ICD-10-CM | POA: Diagnosis present

## 2016-08-02 DIAGNOSIS — I361 Nonrheumatic tricuspid (valve) insufficiency: Secondary | ICD-10-CM | POA: Insufficient documentation

## 2016-08-02 DIAGNOSIS — I378 Other nonrheumatic pulmonary valve disorders: Secondary | ICD-10-CM | POA: Insufficient documentation

## 2016-08-02 DIAGNOSIS — I340 Nonrheumatic mitral (valve) insufficiency: Secondary | ICD-10-CM | POA: Diagnosis not present

## 2016-08-02 DIAGNOSIS — I5030 Unspecified diastolic (congestive) heart failure: Secondary | ICD-10-CM | POA: Insufficient documentation

## 2016-08-02 DIAGNOSIS — I351 Nonrheumatic aortic (valve) insufficiency: Secondary | ICD-10-CM | POA: Diagnosis not present

## 2016-08-02 LAB — ECHOCARDIOGRAM COMPLETE
AVLVOTPG: 7 mmHg
Ao-asc: 35 cm
CHL CUP MV DEC (S): 187
E/e' ratio: 8.67
EWDT: 187 ms
FS: 26 % — AB (ref 28–44)
IVS/LV PW RATIO, ED: 0.9
LA diam index: 2 cm/m2
LA vol index: 29.9 mL/m2
LASIZE: 41 mm
LAVOL: 61.3 mL
LAVOLA4C: 63.2 mL
LDCA: 3.14 cm2
LEFT ATRIUM END SYS DIAM: 41 mm
LV E/e'average: 8.67
LV TDI E'LATERAL: 9.03
LV TDI E'MEDIAL: 9.14
LVEEMED: 8.67
LVELAT: 9.03 cm/s
LVOT SV: 87 mL
LVOT VTI: 27.7 cm
LVOT peak vel: 131 cm/s
LVOTD: 20 mm
Lateral S' vel: 11.5 cm/s
MV Peak grad: 2 mmHg
MV pk A vel: 61.8 m/s
MV pk E vel: 78.3 m/s
MVAP: 4 cm2
P 1/2 time: 55 ms
PW: 10 mm — AB (ref 0.6–1.1)
TAPSE: 33.6 mm

## 2016-08-24 ENCOUNTER — Encounter: Payer: Self-pay | Admitting: Cardiovascular Disease

## 2016-08-24 ENCOUNTER — Ambulatory Visit (INDEPENDENT_AMBULATORY_CARE_PROVIDER_SITE_OTHER): Payer: 59 | Admitting: Cardiovascular Disease

## 2016-08-24 VITALS — BP 144/98 | HR 69 | Ht 70.5 in | Wt 217.0 lb

## 2016-08-24 DIAGNOSIS — I370 Nonrheumatic pulmonary valve stenosis: Secondary | ICD-10-CM | POA: Diagnosis not present

## 2016-08-24 DIAGNOSIS — Z01812 Encounter for preprocedural laboratory examination: Secondary | ICD-10-CM | POA: Diagnosis not present

## 2016-08-24 HISTORY — DX: Nonrheumatic pulmonary valve stenosis: I37.0

## 2016-08-24 NOTE — Patient Instructions (Addendum)
Medication Instructions:    Your physician recommends that you continue on your current medications as directed. Please refer to the Current Medication list given to you today.  - If you need a refill on your cardiac medications before your next appointment, please call your pharmacy.   Labwork:  BMET today  Testing/Procedures: Your physician has requested that you have cardiac CT. Cardiac computed tomography (CT) is a painless test that uses an x-ray machine to take clear, detailed pictures of your heart. For further information please visit [email or web address]. Please follow instruction sheet as given.  Follow-Up:  Your physician wants you to follow-up in: 1 year with Dr. Acie Fredrickson.  You will receive a reminder letter in the mail two months in advance. If you don't receive a letter, please call our office to schedule the follow-up appointment.  Thank you for choosing [facility]!!   Trinidad Curet, RN [phone]  Any Other Special Instructions Will Be Listed Below (If Applicable).   Cardiac CT Angiogram A cardiac CT angiogram is a procedure to look at the heart and the area around the heart. It may be done to help find the cause of chest pains or other symptoms of heart disease. During this procedure, a large X-ray machine, called a CT scanner, takes detailed pictures of the heart and the surrounding area after a dye (contrast material) has been injected into blood vessels in the area. The procedure is also sometimes called a coronary CT angiogram, coronary artery scanning, or CTA. A cardiac CT angiogram allows the health care provider to see how well blood is flowing to and from the heart. The health care provider will be able to see if there are any problems, such as:  Blockage or narrowing of the coronary arteries in the heart.  Fluid around the heart.  Signs of weakness or disease in the muscles, valves, and tissues of the heart. Tell a health care provider about:  Any  allergies you have. This is especially important if you have had a previous allergic reaction to contrast dye.  All medicines you are taking, including vitamins, herbs, eye drops, creams, and over-the-counter medicines.  Any blood disorders you have.  Any surgeries you have had.  Any medical conditions you have.  Whether you are pregnant or may be pregnant.  Any anxiety disorders, chronic pain, or other conditions you have that may increase your stress or prevent you from lying still. What are the risks? Generally, this is a safe procedure. However, problems may occur, including:  Bleeding.  Infection.  Allergic reactions to medicines or dyes.  Damage to other structures or organs.  Kidney damage from the dye or contrast that is used.  Increased risk of cancer from radiation exposure. This risk is low. Talk with your health care provider about:  The risks and benefits of testing.  How you can receive the lowest dose of radiation. What happens before the procedure?  Wear comfortable clothing and remove any jewelry, glasses, dentures, and hearing aids.  Follow instructions from your health care provider about eating and drinking. This may include:  For 12 hours before the test - avoid caffeine. This includes tea, coffee, soda, energy drinks, and diet pills. Drink plenty of water or other fluids that do not have caffeine in them. Being well-hydrated can prevent complications.  For 4-6 hours before the test - stop eating and drinking. The contrast dye can cause nausea, but this is less likely if your stomach is empty.  Ask your health  care provider about changing or stopping your regular medicines. This is especially important if you are taking diabetes medicines, blood thinners, or medicines to treat erectile dysfunction. What happens during the procedure?  Hair on your chest may need to be removed so that small sticky patches called electrodes can be placed on your chest.  These will transmit information that helps to monitor your heart during the test.  An IV tube will be inserted into one of your veins.  You might be given a medicine to control your heart rate during the test. This will help to ensure that good images are obtained.  You will be asked to lie on an exam table. This table will slide in and out of the CT machine during the procedure.  Contrast dye will be injected into the IV tube. You might feel warm, or you may get a metallic taste in your mouth.  You will be given a medicine (nitroglycerin) to relax (dilate) the arteries in your heart.  The table that you are lying on will move into the CT machine tunnel for the scan.  The person running the machine will give you instructions while the scans are being done. You may be asked to:  Keep your arms above your head.  Hold your breath.  Stay very still, even if the table is moving.  When the scanning is complete, you will be moved out of the machine.  The IV tube will be removed. The procedure may vary among health care providers and hospitals. What happens after the procedure?  You might feel warm, or you may get a metallic taste in your mouth from the contrast dye.  You may have a headache from the nitroglycerin.  After the procedure, drink water or other fluids to wash (flush) the contrast material out of your body.  Contact a health care provider if you have any symptoms of allergy to the contrast. These symptoms include:  Shortness of breath.  Rash or hives.  A racing heartbeat.  Most people can return to their normal activities right after the procedure. Ask your health care provider what activities are safe for you.  It is up to you to get the results of your procedure. Ask your health care provider, or the department that is doing the procedure, when your results will be ready. Summary  A cardiac CT angiogram is a procedure to look at the heart and the area around the  heart. It may be done to help find the cause of chest pains or other symptoms of heart disease.  During this procedure, a large X-ray machine, called a CT scanner, takes detailed pictures of the heart and the surrounding area after a dye (contrast material) has been injected into blood vessels in the area.  Ask your health care provider about changing or stopping your regular medicines before the procedure. This is especially important if you are taking diabetes medicines, blood thinners, or medicines to treat erectile dysfunction.  After the procedure, drink water or other fluids to wash (flush) the contrast material out of your body. This information is not intended to replace advice given to you by your health care provider. Make sure you discuss any questions you have with your health care provider. Document Released: 03/01/2008 Document Revised: 02/06/2016 Document Reviewed: 02/06/2016 Elsevier Interactive Patient Education  2017 [employer].

## 2016-08-24 NOTE — Progress Notes (Signed)
Cardiology Office Note   Date:  08/24/2016   ID:  Matthew Lutz, DOB Apr 30, 1970, MRN [id number]  PCP:  Leonard Downing, MD  Cardiologist:   Mertie Moores, MD   Problem List 1.  Heart murmur 2. ? Mild pulmonic stenosis   Chief Complaint  Patient presents with  . Heart Murmur      History of Present Illness:  Matthew Lutz is a 46 y.o. male who is being seen today for the evaluation of heart murmur  at the request of Leonard Downing, *.  He is a Airline pilot.   I saw him 7 years ago for stress and DOE Work up was unremarkable .  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  Works on his fathers farm on the weekends.    Non smoker Occasional beer   Past Medical History:  Diagnosis Date  . Chronic back pain    HNP  . Heart murmur   . Pneumonia    hx of;couple of yrs ago    Past Surgical History:  Procedure Laterality Date  . ESOPHAGOGASTRODUODENOSCOPY    . EXTENSOR TENDON OF FOREARM / WRIST REPAIR    . LUMBAR LAMINECTOMY N/A 10/29/2012   Procedure: RIGHT L5-S1 MICRODISCECTOMY;  Surgeon: Marybelle Killings, MD;  Location: [facility];  Service: Orthopedics;  Laterality: N/A;     Current Outpatient Prescriptions  Medication Sig Dispense Refill  . omeprazole (PRILOSEC) 20 MG capsule Take 20 mg by mouth daily.     No current facility-administered medications for this visit.     No flowsheet data found.    Allergies:   Patient has no known allergies.    Social History:  The patient  reports that he has never smoked. He has quit using smokeless tobacco. He reports that he does not drink alcohol or use drugs.   Family History:  The patient's family history includes Arrhythmia in his sister; Heart attack in his father; Hypertension in his father and mother; Stroke in his father.    ROS:  Please see the history of present illness.    Review of Systems: Constitutional:  denies fever, chills,  diaphoresis, appetite change and fatigue.  HEENT: denies photophobia, eye pain, redness, hearing loss, ear pain, congestion, sore throat, rhinorrhea, sneezing, neck pain, neck stiffness and tinnitus.  Respiratory: denies SOB, DOE, cough, chest tightness, and wheezing.  Cardiovascular: denies chest pain, palpitations and leg swelling.  Gastrointestinal: denies nausea, vomiting, abdominal pain, diarrhea, constipation, blood in stool.  Genitourinary: denies dysuria, urgency, frequency, hematuria, flank pain and difficulty urinating.  Musculoskeletal: denies  myalgias, back pain, joint swelling, arthralgias and gait problem.   Skin: denies pallor, rash and wound.  Neurological: denies dizziness, seizures, syncope, weakness, light-headedness, numbness and headaches.   Hematological: denies adenopathy, easy bruising, personal or family bleeding history.  Psychiatric/ Behavioral: denies suicidal ideation, mood changes, confusion, nervousness, sleep disturbance and agitation.       All other systems are reviewed and negative.    PHYSICAL EXAM: VS:  BP (!) 144/98   Pulse 69   Ht 5' 10.5" (1.791 m)   Wt 217 lb (98.4 kg)   SpO2 97%   BMI 30.70 kg/m  , BMI Body mass index is 30.7 kg/m. GEN: Well nourished, well developed, in no acute distress  HEENT: normal  Neck: no JVD, carotid bruits, or masses Cardiac: RRR; no murmurs, rubs, or gallops,no edema  Respiratory:  clear to auscultation bilaterally, normal work of breathing GI: soft, nontender, nondistended, + BS MS: no deformity or atrophy  Skin: warm and dry, no rash Neuro:  Strength and sensation are intact Psych: normal   EKG:  EKG is ordered today.  NSR at 69.  Normal ECG .    Recent Labs: No results found for requested labs within last 8760 hours.    Lipid Panel No results found for: CHOL, TRIG, HDL, CHOLHDL, VLDL, LDLCALC, LDLDIRECT    Wt Readings from Last 3 Encounters:  08/24/16 217 lb (98.4 kg)  10/24/12 187 lb 12.8  oz (85.2 kg)  08/10/12 211 lb 9.6 oz (96 kg)      Other studies Reviewed: Additional studies/ records that were reviewed today include: . Review of the above records demonstrates:    ASSESSMENT AND PLAN:  1.  Pulmonic stenosis: Patient has had a murmur for the past several years. Echocardiogram reveals a supravalvular pulmonic stenosis. It is fairly ill-defined on the echo but there is a gradient of around 26 mmHg. We'll get a CT Angiogram  for further evaluation of this stenosis. We might also need to get a cardiac MRI. We may also consider transesophageal echo although sometimes is difficult to see this area on TEE.  Will see him in 1 year , sooner if needed   Current medicines are reviewed at length with the patient today.  The patient does not have concerns regarding medicines.  Labs/ tests ordered today include:  No orders of the defined types were placed in this encounter.     Mertie Moores, MD  08/24/2016 2:55 PM    [address], [place], [place]  [address] Phone: [phone]; Fax: [phone]

## 2016-08-25 LAB — BASIC METABOLIC PANEL
BUN / CREAT RATIO: 16 (ref 9–20)
BUN: 16 mg/dL (ref 6–24)
CALCIUM: 9.3 mg/dL (ref 8.7–10.2)
CO2: 23 mmol/L (ref 18–29)
Chloride: 101 mmol/L (ref 96–106)
Creatinine, Ser: 1.02 mg/dL (ref 0.76–1.27)
GFR calc non Af Amer: 88 mL/min/{1.73_m2} (ref >59)
GFR, EST AFRICAN AMERICAN: 101 mL/min/{1.73_m2} (ref >59)
GLUCOSE: 100 mg/dL — AB (ref 65–99)
POTASSIUM: 4.2 mmol/L (ref 3.5–5.2)
Sodium: 141 mmol/L (ref 134–144)

## 2016-09-17 ENCOUNTER — Encounter: Payer: Self-pay | Admitting: Cardiovascular Disease

## 2016-09-18 ENCOUNTER — Telehealth: Payer: Self-pay | Admitting: *Deleted

## 2016-09-18 NOTE — Telephone Encounter (Signed)
-----   Message from Dorothy Spark, MD sent at 09/17/2016  9:24 PM EDT ----- Regarding: RE: Cardiac Ct /ok to do per nelson on old machine/auth # [id number] exp 10/12/16  Thank you  ----- Message ----- From: Armando Gang Sent: 09/17/2016   5:03 PM To: Dorothy Spark, MD, Nuala Alpha, LPN, # Subject: RE: Cardiac Ct Madaline Brilliant to do per nelson on old m#  Schedule 09-26-16 @ 2:30  Meda Coffee to read  ----- Message ----- From: Armando Gang Sent: 09/04/2016   4:22 PM To: Armando Gang Subject: FW: Cardiac Ct Madaline Brilliant to do per nelson on old m#    ----- Message ----- From: Armando Gang Sent: 08/24/2016   3:22 PM To: Armando Gang, [facility] Pre Cert/Auth Subject: Cardiac Ct Madaline Brilliant to do per nelson with her on #  fyi ok to do with nelson - per sherri on the old machine  /saf

## 2016-09-26 ENCOUNTER — Ambulatory Visit (HOSPITAL_COMMUNITY)
Admission: RE | Admit: 2016-09-26 | Discharge: 2016-09-26 | Disposition: A | Discharge status: Home / Self Care | Payer: 59 | Source: Ambulatory Visit | Attending: Cardiovascular Disease | Admitting: Cardiovascular Disease

## 2016-09-26 DIAGNOSIS — Z01812 Encounter for preprocedural laboratory examination: Secondary | ICD-10-CM | POA: Diagnosis not present

## 2016-09-26 DIAGNOSIS — I370 Nonrheumatic pulmonary valve stenosis: Secondary | ICD-10-CM | POA: Insufficient documentation

## 2016-09-26 DIAGNOSIS — I281 Aneurysm of pulmonary artery: Secondary | ICD-10-CM | POA: Insufficient documentation

## 2016-09-26 MED ORDER — NITROGLYCERIN 0.4 MG SL SUBL
0.4000 mg | SUBLINGUAL_TABLET | SUBLINGUAL | Status: DC | PRN
  Administered 2016-09-26: 0.4 mg via SUBLINGUAL

## 2016-09-26 MED ORDER — NITROGLYCERIN 0.4 MG SL SUBL
SUBLINGUAL_TABLET | SUBLINGUAL | Status: AC
  Filled 2016-09-26: qty 2

## 2016-09-26 MED ORDER — METOPROLOL TARTRATE 5 MG/5ML IV SOLN
INTRAVENOUS | Status: AC
  Filled 2016-09-26: qty 5

## 2016-09-26 MED ORDER — IOPAMIDOL (ISOVUE-370) INJECTION 76%
INTRAVENOUS | Status: AC
  Administered 2016-09-26: 80 mL
  Filled 2016-09-26: qty 100

## 2016-09-26 MED ORDER — METOPROLOL TARTRATE 5 MG/5ML IV SOLN
5.0000 mg | Freq: Once | INTRAVENOUS | Status: AC
  Administered 2016-09-26: 5 mg via INTRAVENOUS

## 2017-05-08 IMAGING — MR MR SHOULDER*L* W/O CM
4 of 5 series · 15 of 40 positions shown · non-contrast
Comparison: None.

CLINICAL DATA: Left shoulder pain with a pinching sensation about
the acromioclavicular joint for a few months. No known injury.
Initial encounter.

EXAM:
MRI OF THE LEFT SHOULDER WITHOUT CONTRAST
TECHNIQUE: Multiplanar, multisequence MR imaging of the shoulder was performed.
No intravenous contrast was administered.

[Series 5: T2 fat-sat · axial · left · 3.0mm · 0.44mm/px · z∈[-35,+25]mm · 3 of 25 slices shown (1 of 3)]
[im 4/25]
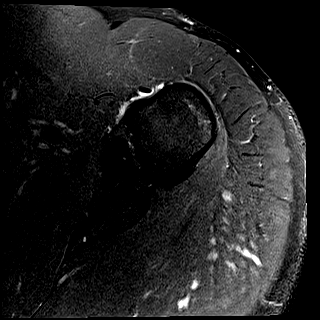
[im 14/25]
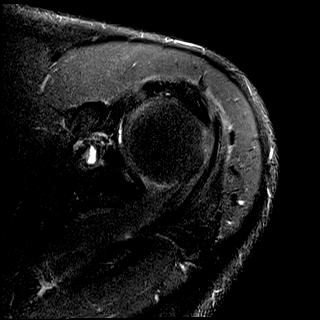
[im 21/25]
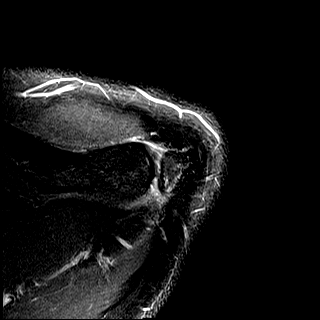

[Series 6: T2 fat-sat · oblique · left · 3.0mm · 0.44mm/px · 3 of 24 slices shown (2 of 3)]
[im 4/24]
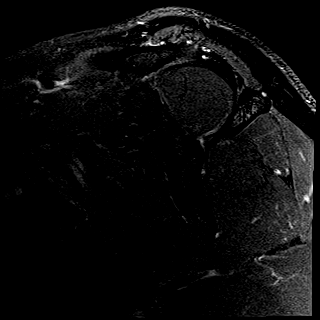
[im 14/24]
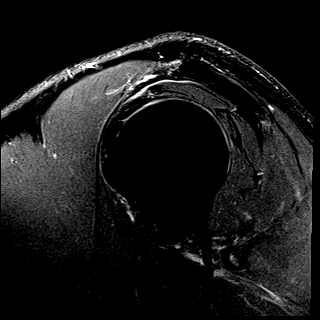
[im 20/24]
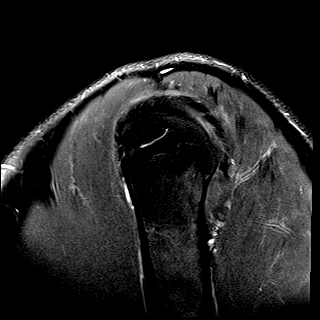

[Series 9: PD · oblique · left · 3.0mm · 0.18mm/px · 6 of 25 slices shown]
[im 1/25]
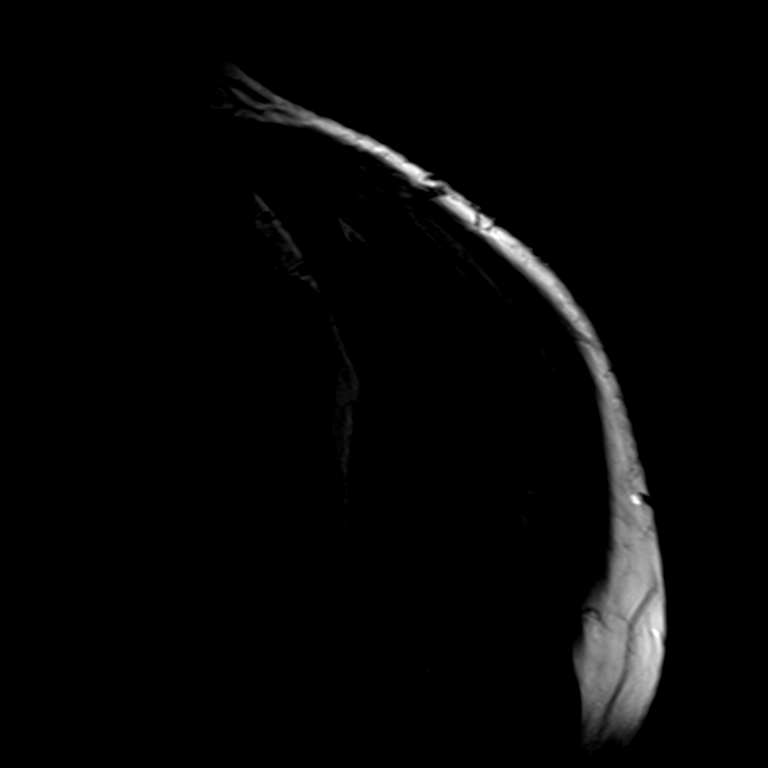
[im 4/25]
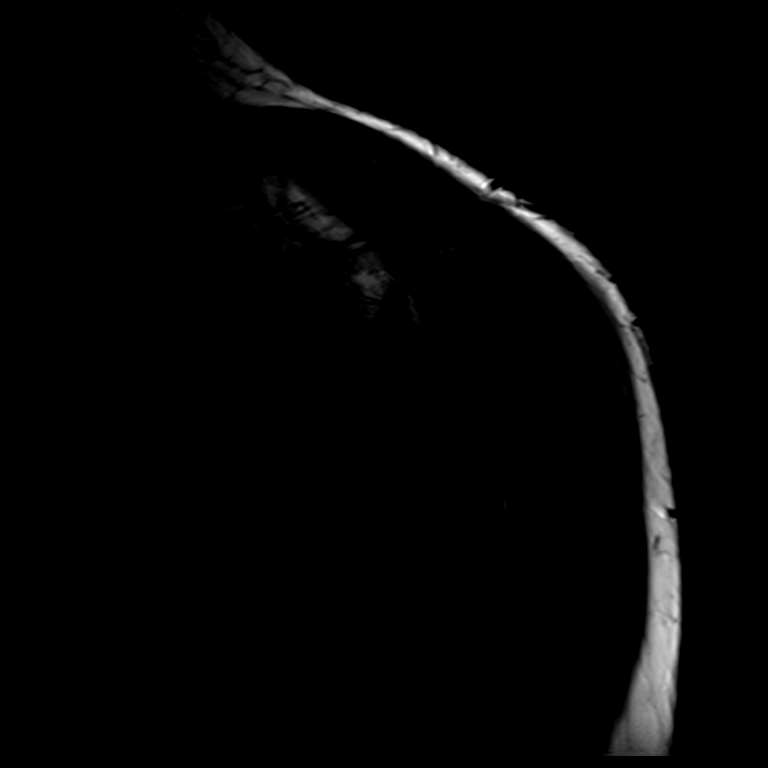
[im 7/25]
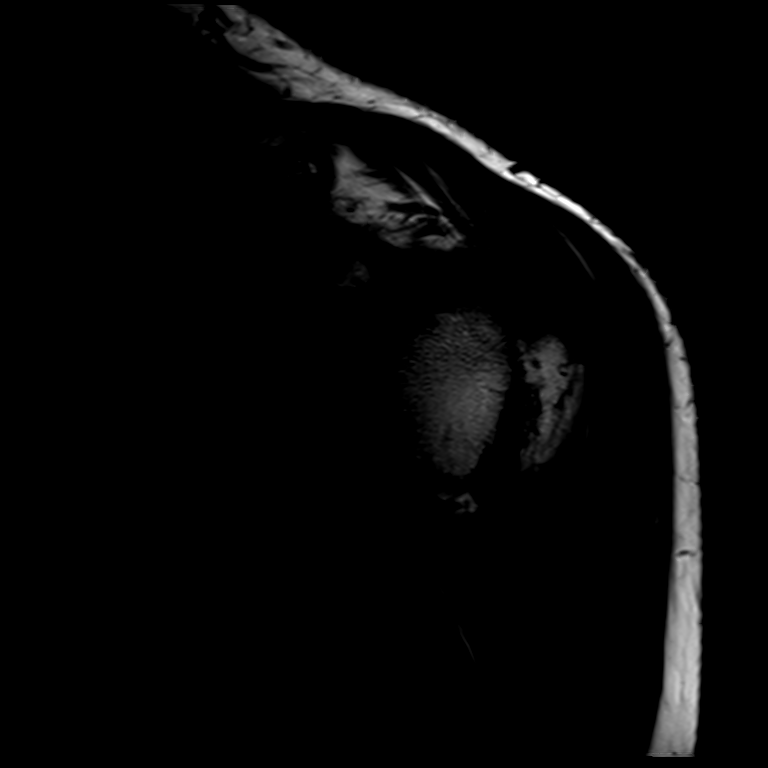
[im 11/25]
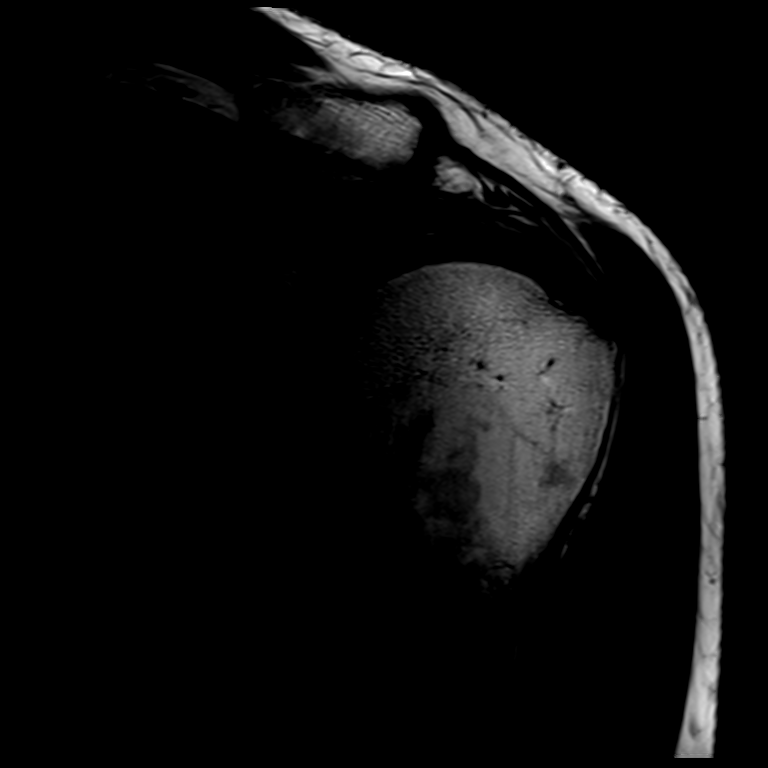
[im 14/25]
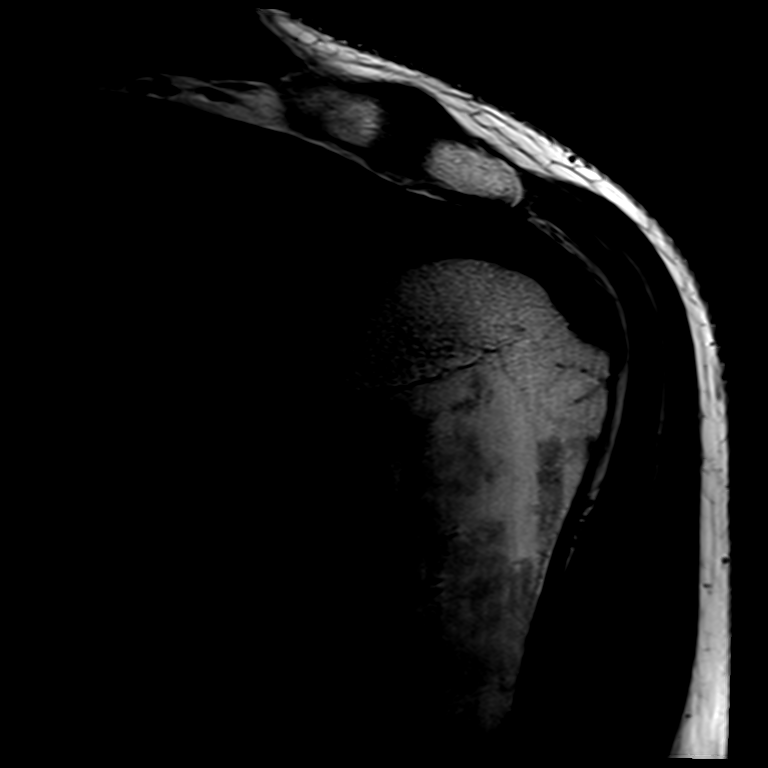
[im 21/25]
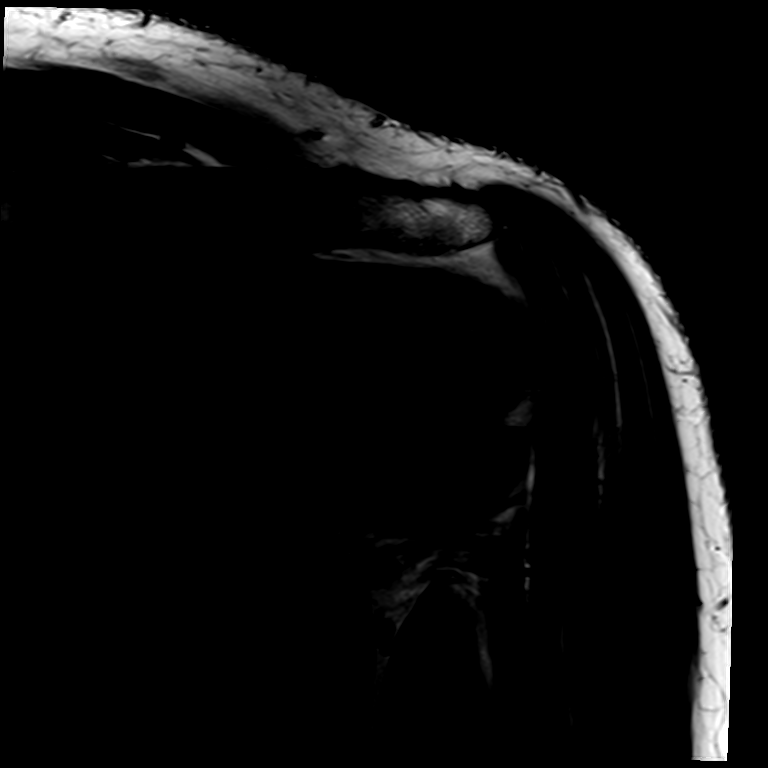

[Series 10: T2 fat-sat · oblique · left · 3.0mm · 0.22mm/px · 3 of 25 slices shown (3 of 3)]
[im 4/25]
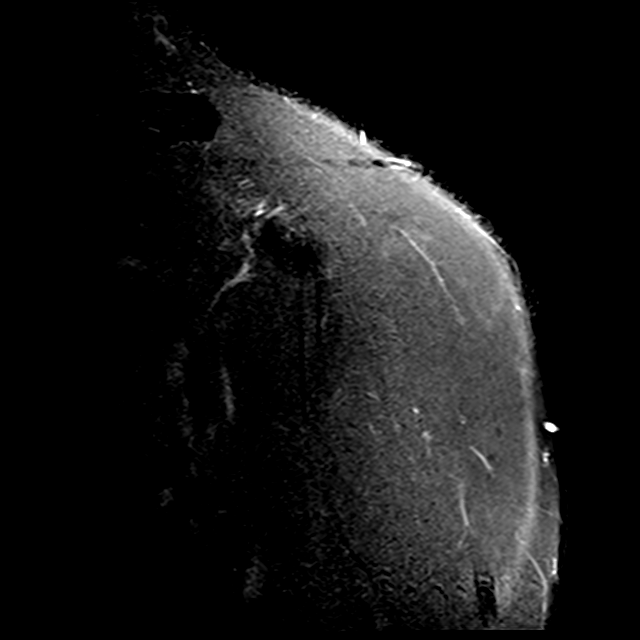
[im 14/25]
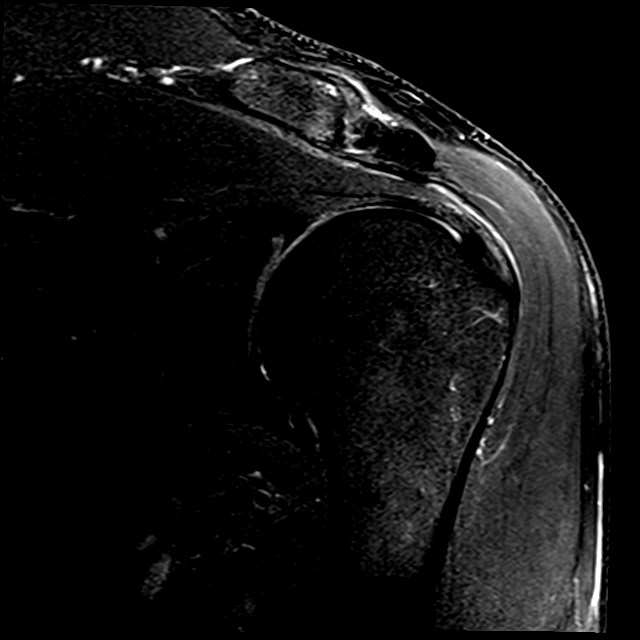
[im 21/25]
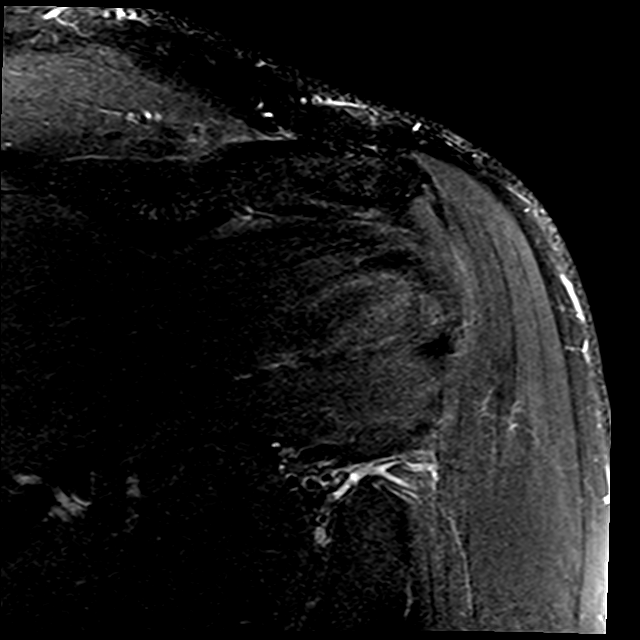

[15 of 40 positions shown; findings below may reference images not displayed]

FINDINGS: Rotator cuff: Mildly increased T2 signal in the supraspinatus and
infraspinatus tendons consistent with tendinopathy is identified. No
tear. The subscapularis and teres minor are unremarkable.

Muscles:  Normal in appearance without atrophy or focal lesion.

Biceps long head:  Intact and normal in appearance.

Acromioclavicular Joint: Moderate degenerative change is identified.

Glenohumeral Joint: Unremarkable.

Labrum:  Intact.

Bones: No fracture or worrisome marrow lesion. The acromion is type
2.

Other: None.
IMPRESSION: Mild appearing supraspinatus and infraspinatus tendinopathy without
tear.

Moderate acromioclavicular osteoarthritis.

## 2017-07-25 ENCOUNTER — Encounter: Payer: Self-pay | Admitting: Physician Assistant

## 2017-08-13 ENCOUNTER — Ambulatory Visit: Payer: 59 | Admitting: Physician Assistant

## 2017-09-17 ENCOUNTER — Encounter (INDEPENDENT_AMBULATORY_CARE_PROVIDER_SITE_OTHER): Payer: Self-pay

## 2017-09-17 ENCOUNTER — Ambulatory Visit: Payer: 59 | Admitting: Physician Assistant

## 2017-09-17 ENCOUNTER — Encounter: Payer: Self-pay | Admitting: Physician Assistant

## 2017-09-17 VITALS — BP 120/90 | HR 71 | Ht 70.0 in | Wt 216.0 lb

## 2017-09-17 DIAGNOSIS — I370 Nonrheumatic pulmonary valve stenosis: Secondary | ICD-10-CM | POA: Diagnosis not present

## 2017-09-17 NOTE — Progress Notes (Signed)
Cardiology Office Note:    Date:  09/17/2017   ID:  Matthew Lutz, DOB 05-Aug-1970, MRN [id number]  PCP:  Leonard Downing, MD  Cardiologist:  Mertie Moores, MD   Referring MD: Leonard Downing, *   Chief Complaint  Patient presents with  . Follow-up    pulmonic stenosis    History of Present Illness:    Matthew Lutz is a 47 y.o. male with pulmonic stenosis.  He was last seen by Dr. Acie Fredrickson 5/18.  An echocardiogram demonstrated normal EF, mild diastolic dysfunction, mild pulmonic stenosis with peak gradient 26 mmHg.  Coronary CTA demonstrated no CAD, supra pulmonic membrane measuring 19 mm associated with poststenotic pulmonary artery dilation measuring 32 x 23 mm.  [name] returns for follow-up.  He is here alone.  He is doing well.  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  He has not had any problems or limitations since last seen.  He denies chest pain, shortness of breath, syncope, orthopnea, PND or significant pedal edema.  Prior CV studies:   The following studies were reviewed today:  Echo 08/02/2016 Study Conclusions  - Left ventricle: The cavity size was normal. The estimated   ejection fraction was 55%. Wall motion was normal; there were no   regional wall motion abnormalities. Doppler parameters are   consistent with abnormal left ventricular relaxation (grade 1   diastolic dysfunction). - Aortic valve: There was no stenosis. There was trivial   regurgitation. - Mitral valve: There was trivial regurgitation. - Right ventricle: The cavity size was normal. Systolic function   was normal. - Pulmonic valve: Poorly visualized but appears thickened, cannot   fully rule out a mass on the valve. Peak gradient 26 mmHg   suggestive of mild pulmonic stenosis. - Pulmonary arteries: No complete TR doppler jet so unable to   estimate PA systolic pressure. - Inferior vena cava: The vessel was normal in size. The   respirophasic diameter changes were in the  normal range (>= 50%),   consistent with normal central venous pressure. Impressions: - Normal LV size and systolic function, EF 85%. Normal RV size and   systolic function. There was mild pulmonic stenosis by mean   gradient. The pulmonic valve was not well-visualized, in one view   there was concern for a mass on the valve but it may just be   valvular thickening. Would consider TEE evaluation.  Coronary CTA 09/26/16 IMPRESSION: 1. Coronary calcium score of 0. This was 0 percentile for age and sex matched control. 2. Normal coronary origin with right dominance. 3. No evidence of CAD. 4. There is supra-pulmonic membrane measuring 19 mm associated with associated post stenotic pulmonary artery dilatation measuring 32 x 23 mm.   Past Medical History:  Diagnosis Date  . Chronic back pain    HNP  . Heart murmur   . Pneumonia    hx of;couple of yrs ago  . Pulmonary valve stenosis 08/24/2016   Surgical Hx: The patient  has a past surgical history that includes Extensor tendon of forearm / wrist repair; Esophagogastroduodenoscopy; and Lumbar laminectomy (N/A, 10/29/2012).   Current Medications: Current Meds  Medication Sig  . omeprazole (PRILOSEC) 20 MG capsule Take 20 mg by mouth daily.     Allergies:   Patient has no known allergies.   Social History   Tobacco Use  . Smoking status: Never Smoker  . Smokeless tobacco: Former Network engineer Use Topics  . Alcohol use: No  .  Drug use: No     Family Hx: The patient's family history includes Arrhythmia in his sister; Heart attack in his father; Hypertension in his father and mother; Stroke in his father.  ROS:   Please see the history of present illness.    ROS All other systems reviewed and are negative.   EKGs/Labs/Other Test Reviewed:    EKG:  EKG is  ordered today.  The ekg ordered today demonstrates normal sinus rhythm, heart rate 56, normal axis, QTC 387  Recent Labs: No results found for requested labs within  last 8760 hours.   Recent Lipid Panel No results found for: CHOL, TRIG, HDL, CHOLHDL, LDLCALC, LDLDIRECT  Physical Exam:    VS:  BP 120/90   Pulse 71   Ht 5\' 10"  (1.778 m)   Wt 216 lb (98 kg)   SpO2 99%   BMI 30.99 kg/m     Wt Readings from Last 3 Encounters:  09/17/17 216 lb (98 kg)  08/24/16 217 lb (98.4 kg)  10/24/12 187 lb 12.8 oz (85.2 kg)     Physical Exam  Constitutional: He is oriented to person, place, and time. He appears well-developed and well-nourished. No distress.  HENT:  Head: Normocephalic and atraumatic.  Neck: Neck supple. No JVD present.  Cardiovascular: Normal rate, regular rhythm, S1 normal and S2 normal. Exam reveals gallop and S3.  No murmur heard. Pulmonary/Chest: Breath sounds normal. He has no rales.  Abdominal: Soft. There is no hepatomegaly.  Musculoskeletal: He exhibits no edema.  Neurological: He is alert and oriented to person, place, and time.  Skin: Skin is warm and dry.    ASSESSMENT & PLAN:    Pulmonary valve stenosis, unspecified etiology  Mild pulmonic stenosis by echocardiogram in 2018.  He is completely asymptomatic.  I reviewed his case again today with Dr. Acie Fredrickson.  We plan to repeat his echo.  We will also try to discuss with structural heart colleagues to determine what intervention, if any, needs to be undertaken.  -Arrange echocardiogram   Dispo:  Return in about 1 year (around 09/18/2018) for Routine Follow Up, w/ Dr. Acie Fredrickson.   Medication Adjustments/Labs and Tests Ordered: Current medicines are reviewed at length with the patient today.  Concerns regarding medicines are outlined above.  Tests Ordered: Orders Placed This Encounter  Procedures  . EKG 12-Lead  . ECHOCARDIOGRAM COMPLETE   Medication Changes: No orders of the defined types were placed in this encounter.   Signed, Richardson Dopp, PA-C  09/17/2017 9:45 AM    [facility] [place], [place], [place]  [address] Phone: [phone];  Fax: [phone]

## 2017-09-17 NOTE — Patient Instructions (Signed)
Medication Instructions:  1. Your physician recommends that you continue on your current medications as directed. Please refer to the Current Medication list given to you today.   Labwork: NONE ORDERED TODAY  Testing/Procedures: Your physician has requested that you have an echocardiogram. Echocardiography is a painless test that uses sound waves to create images of your heart. It provides your doctor with information about the size and shape of your heart and how well your heart's chambers and valves are working. This procedure takes approximately one hour. There are no restrictions for this procedure.    Follow-Up: Your physician wants you to follow-up in: Matthew Lutz  You will receive a reminder letter in the mail two months in advance. If you don't receive a letter, please call our office to schedule the follow-up appointment.   Any Other Special Instructions Will Be Listed Below (If Applicable).     If you need a refill on your cardiac medications before your next appointment, please call your pharmacy.

## 2017-09-23 ENCOUNTER — Other Ambulatory Visit: Payer: Self-pay

## 2017-09-23 ENCOUNTER — Ambulatory Visit (HOSPITAL_COMMUNITY): Payer: 59 | Attending: Internal Medicine

## 2017-09-23 DIAGNOSIS — J189 Pneumonia, unspecified organism: Secondary | ICD-10-CM | POA: Insufficient documentation

## 2017-09-23 DIAGNOSIS — I351 Nonrheumatic aortic (valve) insufficiency: Secondary | ICD-10-CM | POA: Diagnosis not present

## 2017-09-23 DIAGNOSIS — I370 Nonrheumatic pulmonary valve stenosis: Secondary | ICD-10-CM | POA: Diagnosis not present

## 2017-09-26 ENCOUNTER — Encounter: Payer: Self-pay | Admitting: Physician Assistant

## 2017-10-28 ENCOUNTER — Telehealth: Payer: Self-pay | Admitting: *Deleted

## 2017-10-28 DIAGNOSIS — I370 Nonrheumatic pulmonary valve stenosis: Secondary | ICD-10-CM

## 2017-10-28 NOTE — Telephone Encounter (Signed)
Pt has been informed of recommendations per Richardson Dopp, PA conversation with Dr. Jeralyn Bennett from [place]. Pt is agreeable to referral to Dr. Jeralyn Bennett for further evaluation of Pulmonic Stenosis. Pt asked that he see's Dr. Corine Shelter here in his [facility]. I s/w Kim in our HIM Dept and she will send Echo, Coronary CTA and OV notes to Dr. Corine Shelter. Kim in HIM Dept will also get CD's made for the CT and Echo.

## 2017-10-28 NOTE — Telephone Encounter (Signed)
-----   Message from Liliane Shi, PA-C sent at 10/28/2017  7:57 AM EDT ----- Arbie Cookey, Please tell Mr. Ruff that I have spoken with Dr. Jeralyn Bennett from [place].  He is a specialist in adult congenital heart disease.   Dr. Corine Shelter would be happy to see Mr. Swarm to further evaluate his pulmonic stenosis. Please make a referral to Dr. Corine Shelter.  His office number is [phone]. Dr. Corine Shelter asked that we send him our notes and study reports by email as well as send images from Mr. Wehner's prior echocardiograms and the coronary CTA.   Please e-mail office notes, echocardiogram reports, coronary CTA report to [email or web address]. We will have to check with his office on how to determine the best way to get a CD of his imaging studies to Dr. Corine Shelter.  He has an office in our building and comes to [place] to see patients once a month.  I assume that we could have our echo and CT departments make a CD and then send it to his office in our building.  But, he may want to have it before then.  Therefore, inquire with his office what he would like [place] to do. Richardson Dopp, PA-C    10/28/2017 7:56 AM

## 2017-11-12 ENCOUNTER — Telehealth: Payer: Self-pay | Admitting: Physician Assistant

## 2017-11-12 NOTE — Telephone Encounter (Signed)
CT Coronary Cd mailed out to [place] [address]. Echo cannot be mailed out machine in Echo department has not gotten replaced. Arbie Cookey aware once fixed I will have cd made and mailed.

## 2017-11-14 ENCOUNTER — Telehealth: Payer: Self-pay | Admitting: Physician Assistant

## 2017-11-14 NOTE — Telephone Encounter (Signed)
Echo CD was made study date (09/23/17). I mailed to  Dr. Jeralyn Bennett [address] [place] [address]

## 2018-02-03 DIAGNOSIS — Z23 Encounter for immunization: Secondary | ICD-10-CM | POA: Diagnosis not present

## 2018-02-03 DIAGNOSIS — K219 Gastro-esophageal reflux disease without esophagitis: Secondary | ICD-10-CM | POA: Diagnosis not present

## 2018-03-13 DIAGNOSIS — I370 Nonrheumatic pulmonary valve stenosis: Secondary | ICD-10-CM | POA: Diagnosis not present

## 2018-04-07 DIAGNOSIS — J029 Acute pharyngitis, unspecified: Secondary | ICD-10-CM | POA: Diagnosis not present

## 2019-06-24 ENCOUNTER — Ambulatory Visit: Payer: 59 | Admitting: Podiatry

## 2020-04-02 HISTORY — PX: COLONOSCOPY: SHX174

## 2020-06-24 ENCOUNTER — Encounter: Payer: Self-pay | Admitting: Gastroenterology

## 2020-08-26 ENCOUNTER — Other Ambulatory Visit: Payer: Self-pay

## 2020-08-26 ENCOUNTER — Ambulatory Visit (AMBULATORY_SURGERY_CENTER): Payer: 59 | Admitting: *Deleted

## 2020-08-26 VITALS — Ht 70.0 in | Wt 220.0 lb

## 2020-08-26 DIAGNOSIS — Z1211 Encounter for screening for malignant neoplasm of colon: Secondary | ICD-10-CM

## 2020-08-26 MED ORDER — NA SULFATE-K SULFATE-MG SULF 17.5-3.13-1.6 GM/177ML PO SOLN
ORAL | 0 refills | Status: DC
Start: 2020-08-26 — End: 2020-09-07

## 2020-08-26 NOTE — Progress Notes (Signed)
Patient's pre-visit was done today over the phone with the patient due to COVID-19 pandemic. Name,DOB and address verified. Insurance verified. Patient denies any allergies to Eggs and Soy. Patient denies any problems with anesthesia/sedation. Patient denies taking diet pills or blood thinners. Packet of Prep instructions mailed to patient including a copy of a consent form-pt is aware. Patient understands to call us back with any questions or concerns. Patient is aware of our care-partner policy and ESPQZ-30 safety protocol. +covid test on 08/23/2020-pt aware to call us to reschedule if he has symptoms.

## 2020-08-31 ENCOUNTER — Encounter: Payer: Self-pay | Admitting: Gastroenterology

## 2020-09-07 ENCOUNTER — Encounter: Payer: Self-pay | Admitting: Gastroenterology

## 2020-09-07 ENCOUNTER — Ambulatory Visit (AMBULATORY_SURGERY_CENTER): Payer: 59 | Admitting: Gastroenterology

## 2020-09-07 ENCOUNTER — Other Ambulatory Visit: Payer: Self-pay

## 2020-09-07 VITALS — BP 118/73 | HR 59 | Temp 97.3°F | Resp 11 | Ht 70.0 in | Wt 220.0 lb

## 2020-09-07 DIAGNOSIS — D124 Benign neoplasm of descending colon: Secondary | ICD-10-CM

## 2020-09-07 DIAGNOSIS — Z1211 Encounter for screening for malignant neoplasm of colon: Secondary | ICD-10-CM | POA: Diagnosis present

## 2020-09-07 DIAGNOSIS — D122 Benign neoplasm of ascending colon: Secondary | ICD-10-CM

## 2020-09-07 DIAGNOSIS — K633 Ulcer of intestine: Secondary | ICD-10-CM | POA: Diagnosis not present

## 2020-09-07 MED ORDER — SODIUM CHLORIDE 0.9 % IV SOLN: 500.0000 mL | Freq: Once | INTRAVENOUS | Status: DC

## 2020-09-07 NOTE — Op Note (Signed)
[facility] Patient Name: Matthew Lutz Procedure Date: 09/07/2020 8:53 AM MRN: [id number] Endoscopist: Milus Banister , MD Age: 50 Referring MD:  Date of Birth: 01-Mar-1971 Gender: Male Account #: [id number] Procedure:                Colonoscopy Indications:              Screening for colorectal malignant neoplasm Medicines:                Monitored Anesthesia Care Procedure:                Pre-Anesthesia Assessment:                           - Prior to the procedure, a History and Physical                            was performed, and patient medications and                            allergies were reviewed. The patient's tolerance of                            previous anesthesia was also reviewed. The risks                            and benefits of the procedure and the sedation                            options and risks were discussed with the patient.                            All questions were answered, and informed consent                            was obtained. Prior Anticoagulants: The patient has                            taken no previous anticoagulant or antiplatelet                            agents. ASA Grade Assessment: II - A patient with                            mild systemic disease. After reviewing the risks                            and benefits, the patient was deemed in                            satisfactory condition to undergo the procedure.                           After obtaining informed consent, the colonoscope  was passed under direct vision. Throughout the                            procedure, the patient's blood pressure, pulse, and                            oxygen saturations were monitored continuously. The                            Olympus CF-HQ190 [id number]) Colonoscope was                            introduced through the anus and advanced to the the                            cecum, identified by  appendiceal orifice and                            ileocecal valve. The colonoscopy was performed                            without difficulty. The patient tolerated the                            procedure well. The quality of the bowel                            preparation was good. The ileocecal valve,                            appendiceal orifice, and rectum were photographed. Scope In: 9:04:50 AM Scope Out: 9:17:25 AM Scope Withdrawal Time: 0 hours 10 minutes 26 seconds  Total Procedure Duration: 0 hours 12 minutes 35 seconds  Findings:                 Normal terminal ileum.                           Localized mild inflammation characterized by                            erosions, erythema and friability was found at the                            ileocecal valve (1cm focal patch, unlikely IBD).                            Biopsies were taken with a cold forceps for                            histology.                           Three sessile polyps were found in the descending  colon and ascending colon. The polyps were 2 to 6                            mm in size. These polyps were removed with a cold                            snare. Resection and retrieval were complete.                           Multiple small and large-mouthed diverticula were                            found in the left colon.                           The exam was otherwise without abnormality on                            direct and retroflexion views. Complications:            No immediate complications. Estimated blood loss:                            None. Estimated Blood Loss:     Estimated blood loss: none. Impression:               - Normal terminal ileum.                           - Focal, mild inflammation at IC valve (med                            related?, doubt IBD). Biopsied                           - Three 2 to 6 mm polyps in the descending colon                             and in the ascending colon, removed with a cold                            snare. Resected and retrieved.                           - Diverticulosis in the left colon.                           - The examination was otherwise normal on direct                            and retroflexion views. Recommendation:           - Patient has a contact number available for                            emergencies. The signs and  symptoms of potential                            delayed complications were discussed with the                            patient. Return to normal activities tomorrow.                            Written discharge instructions were provided to the                            patient.                           - Resume previous diet.                           - Continue present medications.                           - Await pathology results. Milus Banister, MD 09/07/2020 9:21:22 AM This report has been signed electronically.

## 2020-09-07 NOTE — Progress Notes (Signed)
pt tolerated well. VSS. awake and to recovery. Report given to RN.  

## 2020-09-07 NOTE — Patient Instructions (Signed)
Discharge instructions given. Handouts on polyps and diverticulosis. Resume previous medications. YOU HAD AN ENDOSCOPIC PROCEDURE TODAY AT THE [facility]:   Refer to the procedure report that was given to you for any specific questions about what was found during the examination.  If the procedure report does not answer your questions, please call your gastroenterologist to clarify.  If you requested that your care partner not be given the details of your procedure findings, then the procedure report has been included in a sealed envelope for you to review at your convenience later.  YOU SHOULD EXPECT: Some feelings of bloating in the abdomen. Passage of more gas than usual.  Walking can help get rid of the air that was put into your GI tract during the procedure and reduce the bloating. If you had a lower endoscopy (such as a colonoscopy or flexible sigmoidoscopy) you may notice spotting of blood in your stool or on the toilet paper. If you underwent a bowel prep for your procedure, you may not have a normal bowel movement for a few days.  Please Note:  You might notice some irritation and congestion in your nose or some drainage.  This is from the oxygen used during your procedure.  There is no need for concern and it should clear up in a day or so.  SYMPTOMS TO REPORT IMMEDIATELY:   Following lower endoscopy (colonoscopy or flexible sigmoidoscopy):  Excessive amounts of blood in the stool  Significant tenderness or worsening of abdominal pains  Swelling of the abdomen that is new, acute  Fever of 100F or higher   For urgent or emergent issues, a gastroenterologist can be reached at any hour by calling [phone]. Do not use MyChart messaging for urgent concerns.    DIET:  We do recommend a small meal at first, but then you may proceed to your regular diet.  Drink plenty of fluids but you should avoid alcoholic beverages for 24 hours.  ACTIVITY:  You should plan to take  it easy for the rest of today and you should NOT DRIVE or use heavy machinery until tomorrow (because of the sedation medicines used during the test).    FOLLOW UP: Our staff will call the number listed on your records 48-72 hours following your procedure to check on you and address any questions or concerns that you may have regarding the information given to you following your procedure. If we do not reach you, we will leave a message.  We will attempt to reach you two times.  During this call, we will ask if you have developed any symptoms of COVID 19. If you develop any symptoms (ie: fever, flu-like symptoms, shortness of breath, cough etc.) before then, please call [phone].  If you test positive for Covid 19 in the 2 weeks post procedure, please call and report this information to us.    If any biopsies were taken you will be contacted by phone or by letter within the next 1-3 weeks.  Please call us at [phone] if you have not heard about the biopsies in 3 weeks.    SIGNATURES/CONFIDENTIALITY: You and/or your care partner have signed paperwork which will be entered into your electronic medical record.  These signatures attest to the fact that that the information above on your After Visit Summary has been reviewed and is understood.  Full responsibility of the confidentiality of this discharge information lies with you and/or your care-partner. 

## 2020-09-07 NOTE — Progress Notes (Signed)
Pt's states no medical or surgical changes since previsit or office visit.  VS taken by [facility]

## 2020-09-07 NOTE — Progress Notes (Signed)
Called to room to assist during endoscopic procedure.  Patient ID and intended procedure confirmed with present staff. Received instructions for my participation in the procedure from the performing physician.  

## 2020-09-08 ENCOUNTER — Telehealth: Payer: Self-pay | Admitting: Gastroenterology

## 2020-09-08 ENCOUNTER — Telehealth: Payer: Self-pay | Admitting: *Deleted

## 2020-09-08 ENCOUNTER — Encounter (HOSPITAL_COMMUNITY): Payer: Self-pay | Admitting: Emergency Medicine

## 2020-09-08 ENCOUNTER — Other Ambulatory Visit: Payer: Self-pay

## 2020-09-08 ENCOUNTER — Emergency Department (HOSPITAL_COMMUNITY)
Admission: EM | Admit: 2020-09-08 | Discharge: 2020-09-08 | Disposition: A | Discharge status: Home / Self Care | Payer: 59 | Attending: Emergency Medicine | Admitting: Emergency Medicine

## 2020-09-08 DIAGNOSIS — Z87891 Personal history of nicotine dependence: Secondary | ICD-10-CM | POA: Insufficient documentation

## 2020-09-08 DIAGNOSIS — Z8616 Personal history of COVID-19: Secondary | ICD-10-CM | POA: Insufficient documentation

## 2020-09-08 DIAGNOSIS — K922 Gastrointestinal hemorrhage, unspecified: Secondary | ICD-10-CM | POA: Insufficient documentation

## 2020-09-08 LAB — COMPREHENSIVE METABOLIC PANEL
ALT: 38 U/L (ref 0–44)
AST: 26 U/L (ref 15–41)
Albumin: 3.8 g/dL (ref 3.5–5.0)
Alkaline Phosphatase: 47 U/L (ref 38–126)
Anion gap: 10 (ref 5–15)
BUN: 17 mg/dL (ref 6–20)
CO2: 21 mmol/L — ABNORMAL LOW (ref 22–32)
Calcium: 8.8 mg/dL — ABNORMAL LOW (ref 8.9–10.3)
Chloride: 106 mmol/L (ref 98–111)
Creatinine, Ser: 0.98 mg/dL (ref 0.61–1.24)
GFR, Estimated: >60 mL/min (ref >60)
Glucose, Bld: 121 mg/dL — ABNORMAL HIGH (ref 70–99)
Potassium: 3.9 mmol/L (ref 3.5–5.1)
Sodium: 137 mmol/L (ref 135–145)
Total Bilirubin: 0.6 mg/dL (ref 0.3–1.2)
Total Protein: 6.2 g/dL — ABNORMAL LOW (ref 6.5–8.1)

## 2020-09-08 LAB — CBC WITH DIFFERENTIAL/PLATELET
Abs Immature Granulocytes: 0.02 10*3/uL (ref 0.00–0.07)
Basophils Absolute: 0 10*3/uL (ref 0.0–0.1)
Basophils Relative: 1 %
Eosinophils Absolute: 0.1 10*3/uL (ref 0.0–0.5)
Eosinophils Relative: 1 %
HCT: 38.4 % — ABNORMAL LOW (ref 39.0–52.0)
Hemoglobin: 12.5 g/dL — ABNORMAL LOW (ref 13.0–17.0)
Immature Granulocytes: 0 %
Lymphocytes Relative: 32 %
Lymphs Abs: 2.3 10*3/uL (ref 0.7–4.0)
MCH: 29.1 pg (ref 26.0–34.0)
MCHC: 32.6 g/dL (ref 30.0–36.0)
MCV: 89.3 fL (ref 80.0–100.0)
Monocytes Absolute: 0.4 10*3/uL (ref 0.1–1.0)
Monocytes Relative: 6 %
Neutro Abs: 4.3 10*3/uL (ref 1.7–7.7)
Neutrophils Relative %: 60 %
Platelets: 292 10*3/uL (ref 150–400)
RBC: 4.3 MIL/uL (ref 4.22–5.81)
RDW: 12.8 % (ref 11.5–15.5)
WBC: 7.1 10*3/uL (ref 4.0–10.5)
nRBC: 0 % (ref 0.0–0.2)

## 2020-09-08 NOTE — ED Provider Notes (Signed)
[facility] EMERGENCY DEPARTMENT Provider Note   CSN: [id number] Arrival date & time: 09/08/20  1341     History Chief Complaint  Patient presents with   GI Bleeding    Matthew Lutz is a 50 y.o. male.  HPI  Patient presents to the ED for evaluation of rectal bleeding after colonoscopy.  Patient states he had a screening colonoscopy performed yesterday.  He did have polyps removed during the procedure.  Patient states when he woke up this morning he had 3 large bloody bowel movements.  Patient called his GI doctor and was instructed to come to the hospital to be evaluated.  Patient states he had to do some yard work first and he was able to do that without any difficulty.  He is not having any abdominal pain.  He is not having any lightheadedness.  Its now 5 PM and he states the last time he had a bloody stool was at about 9 AM.  Past Medical History:  Diagnosis Date   Chronic back pain    HNP   COVID-19    GERD (gastroesophageal reflux disease)    Heart murmur    Pneumonia    hx of;couple of yrs ago   Pulmonary valve stenosis 08/24/2016   Echo 6/19: EF 55-97, normal diastolic function, mild LAE, mild rVE, mild RAE, mild pulmonic stenosis (mean 17)    Patient Active Problem List   Diagnosis Date Noted   Pulmonary valve stenosis 08/24/2016   HNP (herniated nucleus pulposus), lumbar 10/29/2012    Class: Diagnosis of    Past Surgical History:  Procedure Laterality Date   ESOPHAGOGASTRODUODENOSCOPY     EXTENSOR TENDON OF FOREARM / WRIST REPAIR     LUMBAR LAMINECTOMY N/A 10/29/2012   Procedure: RIGHT L5-S1 MICRODISCECTOMY;  Surgeon: Marybelle Killings, MD;  Location: [place];  Service: Orthopedics;  Laterality: N/A;       Family History  Problem Relation Age of Onset   Stroke Father        Had Stents placed in.   Hypertension Father    Heart attack Father    Hypertension Mother    Arrhythmia Sister        Handicapped. Hx of tachcardia. Shocked x2 with  defib./CPR   Colon cancer Paternal Grandmother    Colon polyps Neg Hx    Esophageal cancer Neg Hx    Rectal cancer Neg Hx    Stomach cancer Neg Hx     Social History   Tobacco Use   Smoking status: Never   Smokeless tobacco: Former  Scientific laboratory technician Use: Never used  Substance Use Topics   Alcohol use: Yes    Comment: occ beer   Drug use: No    Home Medications Prior to Admission medications   Medication Sig Start Date End Date Taking? Authorizing Provider  omeprazole (PRILOSEC) 20 MG capsule Take 20 mg by mouth daily. 08/23/16   [provider]    Allergies    Patient has no known allergies.  Review of Systems   Review of Systems  All other systems reviewed and are negative.  Physical Exam Updated Vital Signs BP 127/89   Pulse 67   Temp 98 F (36.7 C) (Oral)   Resp 12   SpO2 94%   Physical Exam Vitals and nursing note reviewed.  Constitutional:      General: He is not in acute distress.    Appearance: He is well-developed.  HENT:  Head: Normocephalic and atraumatic.     Right Ear: External ear normal.     Left Ear: External ear normal.  Eyes:     General: No scleral icterus.       Right eye: No discharge.        Left eye: No discharge.     Conjunctiva/sclera: Conjunctivae normal.  Neck:     Trachea: No tracheal deviation.  Cardiovascular:     Rate and Rhythm: Normal rate and regular rhythm.  Pulmonary:     Effort: Pulmonary effort is normal. No respiratory distress.     Breath sounds: Normal breath sounds. No stridor. No wheezing or rales.  Abdominal:     General: Bowel sounds are normal. There is no distension.     Palpations: Abdomen is soft.     Tenderness: There is no abdominal tenderness. There is no guarding or rebound.  Musculoskeletal:        General: No tenderness or deformity.     Cervical back: Neck supple.  Skin:    General: Skin is warm and dry.     Findings: No rash.  Neurological:     General: No focal deficit  present.     Mental Status: He is alert.     Cranial Nerves: No cranial nerve deficit (no facial droop, extraocular movements intact, no slurred speech).     Sensory: No sensory deficit.     Motor: No abnormal muscle tone or seizure activity.     Coordination: Coordination normal.  Psychiatric:        Mood and Affect: Mood normal.    ED Results / Procedures / Treatments   Labs (all labs ordered are listed, but only abnormal results are displayed) Labs Reviewed  CBC WITH DIFFERENTIAL/PLATELET - Abnormal; Notable for the following components:      Result Value   Hemoglobin 12.5 (*)    HCT 38.4 (*)    All other components within normal limits  COMPREHENSIVE METABOLIC PANEL - Abnormal; Notable for the following components:   CO2 21 (*)    Glucose, Bld 121 (*)    Calcium 8.8 (*)    Total Protein 6.2 (*)    All other components within normal limits  TYPE AND SCREEN    EKG None  Radiology No results found.  Procedures Procedures   Medications Ordered in ED Medications - No data to display  ED Course  I have reviewed the triage vital signs and the nursing notes.  Pertinent labs & imaging results that were available during my care of the patient were reviewed by me and considered in my medical decision making (see chart for details).  Clinical Course as of 09/08/20 1751  Thu Sep 08, 2020  1718 Patient's labs have been reviewed.  Metabolic panel is unremarkable [JK]  1718 Hemoglobin is decreased at 12.5 [JK]  1719 Previous hemoglobin 7 years ago was 15. [GM]  0102 Discussed with Dr Havery Moros.  Most likely to stop on his own.  Pt could go home if he is comfortable also reasonable to have him stay overnight if he would like to be observed.  If he rebleeds he would need to come back immediately.  I will discuss options with patient. [JK]    Clinical Course User Index [JK] Dorie Rank, MD   MDM Rules/Calculators/A&P                          Patient presented to the ED for  evaluation of GI bleeding after a colonoscopy with polypectomy yesterday.  Patient had 3 episodes of bright red blood in his stool this morning but has not had any episodes since 9 AM.  Patient is asymptomatic.  He was able to do yard work throughout the entire day today.  He is not having any abdominal pain.  He is not feeling lightheaded.  Patient's hemoglobin however is decreased to 12.5.  Last hemoglobin we had was over 7 years ago and at that time was 15.  I discussed the case with Dr. Havery Moros.  He felt that the patient could go home if he was feeling well and feels like he could come back to the emergency room without difficulty.  It would also be reasonable to observe the patient overnight.  Patient was given the option to either stay overnight or go home with strict precautions.  Patient states he is feeling well and would like to go home.  He does not want to be admitted for observation.  His wife will be with him at home.  He understands to return to the ED if he has any recurrent bright red blood in his stool.   Final Clinical Impression(s) / ED Diagnoses Final diagnoses:  Lower GI bleed    Rx / DC Orders ED Discharge Orders     None        Dorie Rank, MD 09/08/20 1751

## 2020-09-08 NOTE — Telephone Encounter (Signed)
I spoke with the pt and he agrees to go to the ED for eval

## 2020-09-08 NOTE — Telephone Encounter (Signed)
Inbound call from pt stating he had a procedure yesterday and he is having an excessive amount of blood in his stool. Please advise. Thank you

## 2020-09-08 NOTE — Discharge Instructions (Addendum)
Avoid taking any aspirin or ibuprofen.  Return to the emergency room if you have any recurrent episodes of bright red blood in your stools.

## 2020-09-08 NOTE — Telephone Encounter (Signed)
Odd since the polyps were pretty small but with his symptoms he needs to go to the ER. thanks

## 2020-09-08 NOTE — Telephone Encounter (Signed)
PT called this am he is post colon from yesterday with excessive bleeding x 2 bowel movements this morning. Please advise.

## 2020-09-08 NOTE — ED Notes (Signed)
Patient reports significant bleeding around 0930 this morning. Reports finishing work today and "mowing multiple lawns prior to coming in to ED. Denies Shob, dizziness, lightheadedness or any other symptoms at this time.

## 2020-09-08 NOTE — ED Triage Notes (Signed)
Patient here for evaluation of GI bleeding today after colonoscopy yesterday. Patient reports three episodes of bright red blood in his stool. Patient alert, oriented, ambulatory, and in no apparent distress at this time.

## 2020-09-08 NOTE — ED Provider Notes (Signed)
Emergency Medicine Provider Triage Evaluation Note  Matthew Lutz , a 50 y.o. male  was evaluated in triage.  Pt complains of bloody stool.  Screening coloscopy with Dr. Ardis Hughs yesterday. Noted 3 x BRB in stool today. No associated abd pain. No lightheadedness, dizziness. Sent from GI for evaluation Had small polys removed yesterday.  Review of Systems  Positive: Bloody stools Negative: Abd pain, lightheadedness  Physical Exam  BP 139/82 (BP Location: Right Arm)   Pulse 87   Temp 98.7 F (37.1 C) (Oral)   Resp 18   SpO2 100%  Gen:   Awake, no distress   Resp:  Normal effort  MSK:   Moves extremities without difficulty  Other:    Medical Decision Making  Medically screening exam initiated at 1:50 PM.  Appropriate orders placed.  [name] was informed that the remainder of the evaluation will be completed by another provider, this initial triage assessment does not replace that evaluation, and the importance of remaining in the ED until their evaluation is complete.  Blood in stool   Denzell Colasanti A, PA-C 09/08/20 1352    Valarie Merino, MD 09/11/20 1120

## 2020-09-09 ENCOUNTER — Encounter: Payer: Self-pay | Admitting: Gastroenterology

## 2020-09-09 ENCOUNTER — Telehealth: Payer: Self-pay

## 2020-09-09 NOTE — Telephone Encounter (Signed)
  Follow up Call-  Call back number 09/07/2020  Post procedure Call Back phone  # [phone]  Permission to leave phone message Yes  Some recent data might be hidden     Patient questions:  Do you have a fever, pain , or abdominal swelling? No. Pain Score  0 *  Have you tolerated food without any problems? Yes.    Have you been able to return to your normal activities? Yes.    Do you have any questions about your discharge instructions: Diet   No. Medications  No. Follow up visit  No.  Do you have questions or concerns about your Care? Yes.  Had rectal bleeding, went to ER as advised, blood work drawn, sent home. Bleeding decreased this am. Feeling great, eating normal food and normal activity. No pain.   Actions: * If pain score is 4 or above: No action needed, pain <4.  Have you developed a fever since your procedure? no  2.   Have you had an respiratory symptoms (SOB or cough) since your procedure? no  3.   Have you tested positive for COVID 19 since your procedure no  4.   Have you had any family members/close contacts diagnosed with the COVID 19 since your procedure?  no   If yes to any of these questions please route to Joylene [name], RN and Joella Prince, RN

## 2020-09-15 ENCOUNTER — Telehealth: Payer: Self-pay | Admitting: Gastroenterology

## 2020-09-15 NOTE — Telephone Encounter (Signed)
I spoke with the pt's wife and she states the pain is not severe and more of a discomfort.  The pt and his wife have been advised that if he develops severe or worse pain he should call back or go to the ED. The pt has been advised of the information and verbalized understanding.

## 2020-09-15 NOTE — Telephone Encounter (Signed)
I spoke with the pt's wife and she states that after recent colon the pt had lower gi bleed. He was seen in the ED and was stable and discharged.  He has not had any further bleeding but does state he has some abd discomfort.  He has been advised to have clear/full liquids for now and call back if the discomfort does not improve or go to the ED for eval if the bleeding returns.  FYI Dr Ardis Hughs   Dr Carlean Purl as DOD can you review to make sure the pt is ok to observe symptoms at home with ED eval if bleeding returns or symptoms of abd discomfort worsen?

## 2020-09-15 NOTE — Telephone Encounter (Signed)
It depends upon how much pain he is having - initial message indicates "a lot of pain"  I have an 0830 slot and could check him in AM  If not bad pain ok to observe

## 2020-09-15 NOTE — Telephone Encounter (Signed)
Inbound call from patient wife, Chong Sicilian. States patient was recently in the ER following his procedure for blood in stool. States the blood in stool has gotten better now he is just in a lot of pain. Best contact number [phone]

## 2022-03-17 ENCOUNTER — Other Ambulatory Visit (HOSPITAL_BASED_OUTPATIENT_CLINIC_OR_DEPARTMENT_OTHER): Payer: Self-pay

## 2022-03-17 DIAGNOSIS — R0683 Snoring: Secondary | ICD-10-CM

## 2022-05-07 ENCOUNTER — Ambulatory Visit (HOSPITAL_BASED_OUTPATIENT_CLINIC_OR_DEPARTMENT_OTHER): Payer: 59 | Attending: Family Medicine | Admitting: Internal Medicine

## 2022-05-07 VITALS — Ht 70.0 in | Wt 225.0 lb

## 2022-05-07 DIAGNOSIS — R0683 Snoring: Secondary | ICD-10-CM | POA: Insufficient documentation

## 2022-05-07 DIAGNOSIS — G4733 Obstructive sleep apnea (adult) (pediatric): Secondary | ICD-10-CM | POA: Diagnosis not present

## 2022-05-12 DIAGNOSIS — R0683 Snoring: Secondary | ICD-10-CM

## 2022-05-12 NOTE — Procedures (Signed)
Patient Name: Matthew Lutz, Matthew Lutz Date: 05/07/2022 Gender: Male D.O.B: 1970-11-25 Age (years): 51 Referring Provider: Claris Gower Height (inches): 70 Interpreting Physician: Baird Lyons MD, ABSM Weight (lbs): 225 RPSGT: Gwenyth Allegra BMI: 32 MRN: [id number] Neck Size: 18.00  CLINICAL INFORMATION Sleep Study Type: Split Night CPAP Indication for sleep study: Snoring Epworth Sleepiness Score: 8  SLEEP STUDY TECHNIQUE As per the AASM Manual for the Scoring of Sleep and Associated Events v2.3 (April 2016) with a hypopnea requiring 4% desaturations.  The channels recorded and monitored were frontal, central and occipital EEG, electrooculogram (EOG), submentalis EMG (chin), nasal and oral airflow, thoracic and abdominal wall motion, anterior tibialis EMG, snore microphone, electrocardiogram, and pulse oximetry. Continuous positive airway pressure (CPAP) was initiated when the patient met split night criteria and was titrated according to treat sleep-disordered breathing.  MEDICATIONS Medications self-administered by patient taken the night of the study : none reported  RESPIRATORY PARAMETERS Diagnostic  Total AHI (/hr): 42.5 RDI (/hr): 56.7 OA Index (/hr): 4.4 CA Index (/hr): 4.4 REM AHI (/hr): 74.2 NREM AHI (/hr): 34.4 Supine AHI (/hr): N/A Non-supine AHI (/hr): 42.5 Min O2 Sat (%): 86.0 Mean O2 (%): 92.5 Time below 88% (min): 6.4   Titration  Optimal Pressure (cm): 16 AHI at Optimal Pressure (/hr): 0 Min O2 at Optimal Pressure (%): 93.0 Supine % at Optimal (%): 9 Sleep % at Optimal (%): 91   SLEEP ARCHITECTURE The recording time for the entire night was 362.1 minutes.  During a baseline period of 154.4 minutes, the patient slept for 135.5 minutes in REM and nonREM, yielding a sleep efficiency of 87.8%. Sleep onset after lights out was 7.2 minutes with a REM latency of 69.5 minutes. The patient spent 19.6% of the night in stage N1 sleep, 60.1% in stage N2 sleep, 0.0%  in stage N3 and 20.3% in REM.  During the titration period of 206.6 minutes, the patient slept for 166.5 minutes in REM and nonREM, yielding a sleep efficiency of 80.6%. Sleep onset after CPAP initiation was 17.8 minutes with a REM latency of 31.5 minutes. The patient spent 11.4% of the night in stage N1 sleep, 37.8% in stage N2 sleep, 0.0% in stage N3 and 50.8% in REM.  CARDIAC DATA The 2 lead EKG demonstrated sinus rhythm. The mean heart rate was 53.6 beats per minute. Other EKG findings include: None.  LEG MOVEMENT DATA The total Periodic Limb Movements of Sleep (PLMS) were 0. The PLMS index was 0.0 .  IMPRESSIONS - Severe obstructive sleep apnea occurred during the diagnostic portion of the study (AHI = 42.5/hour). An optimal PAP pressure was selected for this patient (16 cm of water) - No significant central sleep apnea occurred during the diagnostic portion of the study (CAI = 4.4/hour). - Mild oxygen desaturation was noted during the diagnostic portion of the study (Min O2 = 86.0%). Minimum O2 saturation on CPAP 16 was 93%. - The patient snored with loud snoring volume during the diagnostic portion of the study. - No cardiac abnormalities were noted during this study. - Clinically significant periodic limb movements did not occur during sleep.  DIAGNOSIS - Obstructive Sleep Apnea (G47.33)  RECOMMENDATIONS - Trial of CPAP therapy on 16 cm H2O or autopap 10-20. - Patient used a Medium size Resmed Full Face Mirage Quattro mask and heated humidification. - Be careful with alcohol, sedatives and other CNS depressants that may worsen sleep apnea and disrupt normal sleep architecture. - Sleep hygiene should be reviewed to assess factors that  may improve sleep quality. - Weight management and regular exercise should be initiated or continued.  [Electronically signed] 05/12/2022 11:14 AM  Baird Lyons MD, ABSM Diplomate, American Board of Sleep Medicine NPI: [id number]                          [place], [place] [facility]  ELECTRONICALLY SIGNED ON:  05/12/2022, 11:10 AM [place] PH: [phone]   FX: [phone] [place]

## 2023-05-28 ENCOUNTER — Other Ambulatory Visit (INDEPENDENT_AMBULATORY_CARE_PROVIDER_SITE_OTHER): Payer: 59

## 2023-05-28 ENCOUNTER — Encounter: Payer: Self-pay | Admitting: Orthopaedic Surgery

## 2023-05-28 ENCOUNTER — Ambulatory Visit: Payer: 59 | Admitting: Orthopaedic Surgery

## 2023-05-28 VITALS — BP 101/65 | HR 62 | Ht 70.0 in | Wt 225.0 lb

## 2023-05-28 DIAGNOSIS — M545 Low back pain, unspecified: Secondary | ICD-10-CM

## 2023-05-28 MED ORDER — PREDNISONE 10 MG (21) PO TBPK: ORAL_TABLET | ORAL | 0 refills | Status: DC

## 2023-05-28 NOTE — Progress Notes (Signed)
 Office Visit Note   Patient: Matthew Lutz           Date of Birth: 01/08/1971           MRN: [id number] Visit Date: 05/28/2023              Requested by: Kaleen Mask, MD [address] [place],  [place] [address] PCP: Kaleen Mask, MD   Assessment & Plan: Visit Diagnoses:  1. Acute right-sided low back pain, unspecified whether sciatica present     Plan: Will place patient on some prednisone 10 mg 4 tablets daily x 5 days.  He is if he is having persistent problems in 6-week he come back and see Dr. Christell Constant.  He can call in a few weeks if he still having some problems and physical therapy could be ordered.  We discussed that he normally needs 6 weeks of conservative treatment but present symptoms are not severe enough to consider more invasive treatment.  Will continue to work on a walking program.  Follow-Up Instructions: Return if symptoms worsen or fail to improve.   Orders:  Orders Placed This Encounter  Procedures   XR Lumbar Spine 2-3 Views   Meds ordered this encounter  Medications   predniSONE (STERAPRED UNI-PAK 21 TAB) 10 MG (21) TBPK tablet    Sig: Take 4 po daily with food until gone.    Dispense:  20 tablet    Refill:  0      Procedures: No procedures performed   Clinical Data: No additional findings.   Subjective: Chief Complaint  Patient presents with   Lower Back - Pain    HPI patient with previous microdiscectomy by me right L5-S1 in 2014.  He has done well for 11 years and then started having back pain and some right leg pain in his buttocks about 2 weeks ago.  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  He has not had any sensation that goes down to his foot.  He normally walk daily but stopped walking when he started having back and buttocks pain on the right.  No associated bowel or bladder symptoms.  Review of Systems through history of pulmonary valvular stenosis.  Currently not symptomatic.  L5-S1  H&P microdiscectomy 2014.   Objective: Vital Signs: BP 101/65   Pulse 62   Ht 5\' 10"  (1.778 m)   Wt 225 lb (102.1 kg)   BMI 32.28 kg/m   Physical Exam Constitutional:      Appearance: He is well-developed.  HENT:     Head: Normocephalic and atraumatic.     Right Ear: External ear normal.     Left Ear: External ear normal.  Eyes:     Pupils: Pupils are equal, round, and reactive to light.  Neck:     Thyroid: No thyromegaly.     Trachea: No tracheal deviation.  Cardiovascular:     Rate and Rhythm: Normal rate.  Pulmonary:     Effort: Pulmonary effort is normal.     Breath sounds: No wheezing.  Abdominal:     General: Bowel sounds are normal.     Palpations: Abdomen is soft.  Musculoskeletal:     Cervical back: Neck supple.  Skin:    General: Skin is warm and dry.     Capillary Refill: Capillary refill takes less than 2 seconds.  Neurological:     Mental Status: He is alert and oriented to person, place, and time.  Psychiatric:  Behavior: Behavior normal.        Thought Content: Thought content normal.        Judgment: Judgment normal.     Ortho Exam patient some lumbar tenderness lumbar incision looks good mild sciatic notch tenderness on the right negative on the left negative straight leg raising 90 degrees.  He is able to heel and toe walk.  Anterior tib gastrocsoleus is strong.  Specialty Comments:  No specialty comments available.  Imaging: XR Lumbar Spine 2-3 Views Result Date: 05/28/2023 AP lateral lumbar images are obtained and reviewed.  Right L5 laminotomy defect noted.  Mild L5-S1 disc space narrowing no listhesis. Impression: Lumbar images without acute changes.  Mild L5-S1 disc space narrowing.    PMFS History: Patient Active Problem List   Diagnosis Date Noted   Pulmonary valve stenosis 08/24/2016   HNP (herniated nucleus pulposus), lumbar 10/29/2012    Class: Diagnosis of   Past Medical History:  Diagnosis Date   Chronic back pain     HNP   COVID-19    GERD (gastroesophageal reflux disease)    Heart murmur    Pneumonia    hx of;couple of yrs ago   Pulmonary valve stenosis 08/24/2016   Echo 6/19: EF 60-65, normal diastolic function, mild LAE, mild rVE, mild RAE, mild pulmonic stenosis (mean 17)    Family History  Problem Relation Age of Onset   Stroke Father        Had Stents placed in.   Hypertension Father    Heart attack Father    Hypertension Mother    Arrhythmia Sister        Handicapped. Hx of tachcardia. Shocked x2 with defib./CPR   Colon cancer Paternal Grandmother    Colon polyps Neg Hx    Esophageal cancer Neg Hx    Rectal cancer Neg Hx    Stomach cancer Neg Hx     Past Surgical History:  Procedure Laterality Date   ESOPHAGOGASTRODUODENOSCOPY     EXTENSOR TENDON OF FOREARM / WRIST REPAIR     LUMBAR LAMINECTOMY N/A 10/29/2012   Procedure: RIGHT L5-S1 MICRODISCECTOMY;  Surgeon: Eldred Manges, MD;  Location: [facility];  Service: Orthopedics;  Laterality: N/A;   Social History   Occupational History   Not on file  Tobacco Use   Smoking status: Never   Smokeless tobacco: Former  Building services engineer status: Never Used  Substance and Sexual Activity   Alcohol use: Yes    Comment: occ beer   Drug use: No   Sexual activity: Not Currently

## 2023-06-03 ENCOUNTER — Encounter: Payer: Self-pay | Admitting: Orthopaedic Surgery

## 2023-06-04 ENCOUNTER — Other Ambulatory Visit: Payer: Self-pay

## 2023-06-04 DIAGNOSIS — M545 Low back pain, unspecified: Secondary | ICD-10-CM

## 2023-06-12 ENCOUNTER — Encounter: Payer: Self-pay | Admitting: Gastroenterology

## 2023-06-12 ENCOUNTER — Encounter: Payer: Self-pay | Admitting: Rehabilitative and Restorative Service Providers"

## 2023-06-12 ENCOUNTER — Ambulatory Visit: Admitting: Rehabilitative and Restorative Service Providers"

## 2023-06-12 DIAGNOSIS — R293 Abnormal posture: Secondary | ICD-10-CM

## 2023-06-12 DIAGNOSIS — M79604 Pain in right leg: Secondary | ICD-10-CM | POA: Diagnosis not present

## 2023-06-12 DIAGNOSIS — M5459 Other low back pain: Secondary | ICD-10-CM | POA: Diagnosis not present

## 2023-06-12 NOTE — Therapy (Signed)
 OUTPATIENT PHYSICAL THERAPY EVALUATION   Patient Name: Matthew Lutz MRN: [id number] DOB:1970/08/13, 53 y.o., male Today's Date: 06/12/2023  END OF SESSION:  PT End of Session - 06/12/23 1258     Visit Number 1    Number of Visits 20    Date for PT Re-Evaluation 08/21/23    Authorization Type [employer] $10 copay    Progress Note Due on Visit 10    PT Start Time 1258    PT Stop Time 1334    PT Time Calculation (min) 36 min    Activity Tolerance Patient tolerated treatment well    Behavior During Therapy WFL for tasks assessed/performed             Past Medical History:  Diagnosis Date   Chronic back pain    HNP   COVID-19    GERD (gastroesophageal reflux disease)    Heart murmur    Pneumonia    hx of;couple of yrs ago   Pulmonary valve stenosis 08/24/2016   Echo 6/19: EF 60-65, normal diastolic function, mild LAE, mild rVE, mild RAE, mild pulmonic stenosis (mean 17)   Past Surgical History:  Procedure Laterality Date   ESOPHAGOGASTRODUODENOSCOPY     EXTENSOR TENDON OF FOREARM / WRIST REPAIR     LUMBAR LAMINECTOMY N/A 10/29/2012   Procedure: RIGHT L5-S1 MICRODISCECTOMY;  Surgeon: Eldred Manges, MD;  Location: [facility] OR;  Service: Orthopedics;  Laterality: N/A;   Patient Active Problem List   Diagnosis Date Noted   Pulmonary valve stenosis 08/24/2016   HNP (herniated nucleus pulposus), lumbar 10/29/2012    Class: Diagnosis of    PCP: Windle Guard MD  REFERRING PROVIDER: Eldred Manges, MD  REFERRING DIAG: M54.50 (ICD-10-CM) - Acute right-sided low back pain, unspecified whether sciatica present  Rationale for Evaluation and Treatment: Rehabilitation  THERAPY DIAG:  Other low back pain - Plan: PT plan of care cert/re-cert  Pain in right leg - Plan: PT plan of care cert/re-cert  Abnormal posture - Plan: PT plan of care cert/re-cert  ONSET DATE: 05/2023  SUBJECTIVE:                                                                                                                                                                                            SUBJECTIVE STATEMENT: Pt indicated complaints in back and Rt posterior/lateral thigh above knee.  Reported mainly painful but sometimes numbness/tingling.  Reported difficulty c lying down on back and Lt side.  Prednisone pack given 05/28/2023 with improvement while taking it.   No changes to bowel or bladder control.  Stays above knee.  PERTINENT HISTORY:  Previous microdiscectomy L5/S1 Rt in 2014.   PAIN:  NPRS scale: at worst 7/10, at best 0/10.  Current 2-3/10 Pain location: back on Rt thigh Pain description: can be sharp, dull constant at times.  Aggravating factors: Static positioning sitting/standing/lying down  Relieving factors: moving around, steroid, walking  PRECAUTIONS: None  WEIGHT BEARING RESTRICTIONS: No  FALLS:  Has patient fallen in last 6 months? No  LIVING ENVIRONMENT:  Lives in: House/apartment  OCCUPATION: Theatre stage manager  PLOF: Independent, walking, hunt, fish, kayak  PATIENT GOALS: Reduce pain   OBJECTIVE:   IMAGING: XR Lumbar Spine 2-3 Views Result Date: 05/28/2023 AP lateral lumbar images are obtained and reviewed.  Right L5 laminotomy defect noted.  Mild L5-S1 disc space narrowing no listhesis. Impression: Lumbar images without acute changes.  Mild L5-S1 disc space narrowing  PATIENT SURVEYS:   Patient-Specific Activity Scoring Scheme  "0" represents "unable to perform." "10" represents "able to perform at prior level. 0 1 2 3 4 5 6 7 8 9  10 (Date and Score)   Activity 06/12/2023    1. Sleeping/lying   6    2. Sitting > 1 hr  5    3.     4.    5.    Score 5.5    Total score = sum of the activity scores/number of activities Minimum detectable change (90%CI) for average score = 2 points Minimum detectable change (90%CI) for single activity score = 3 points  SCREENING FOR RED FLAGS: 06/12/2023 Bowel or bladder incontinence: No Cauda equina syndrome:  No  COGNITION: 06/12/2023 Overall cognitive status: WFL normal      SENSATION: 06/12/2023 [facility]  MUSCLE LENGTH: 06/12/2023 Passive hamstring SLR in supine 45 deg bilaterally with general tightness noted.   POSTURE:  06/12/2023 Mild reduced lumbar lordosis. No observed lateral shift.   PALPATION: 06/12/2023 No specific tenderness to touch.   cPA lumbar limited in motion and painful noted in testing.   LUMBAR ROM:  06/12/2023 Directional Preference Assessment: Centralization: none observed Peripheralization: none observed  AROM 06/12/2023  Flexion Pulling in back,, movement to ankle  Extension 75 % WFl  Right lateral flexion Pain in Rt lumbar, limited to mid thigh   Left lateral flexion WFL  Right rotation   Left rotation    (Blank rows = not tested)  LOWER EXTREMITY ROM:      Right 06/12/2023 Left 06/12/2023  Hip flexion    Hip extension    Hip abduction    Hip adduction    Hip internal rotation    Hip external rotation    Knee flexion    Knee extension    Ankle dorsiflexion    Ankle plantarflexion    Ankle inversion    Ankle eversion     (Blank rows = not tested)  LOWER EXTREMITY MMT:    MMT Right 06/12/2023 Left 06/12/2023  Hip flexion 5/5 5/5  Hip extension 5/5 5/5  Hip abduction 5/5   Hip adduction    Hip internal rotation    Hip external rotation    Knee flexion 5/5 5/5  Knee extension 5/5 5/5  Ankle dorsiflexion 5/5 5/5  Ankle plantarflexion    Ankle inversion    Ankle eversion     (Blank rows = not tested)  SPECIAL TESTS:  06/12/2023 (+) Slump for distal calf pulling on Rt, (-) on Lt.  (-) crossed SLR bilaterally   FUNCTIONAL TESTS:  06/12/2023 18 inch chair s UE assist on 1st try.  GAIT: 06/12/2023 Independent ambulation s deviation.                                                                                                                                                                                                                     TODAY'S TREATMENT:                                                                                                         DATE: 06/12/2023  Therex:    HEP instruction/performance c cues for techniques, handout provided.  Trial set performed of each for comprehension and symptom assessment.  See below for exercise list  Manual Lt sidelying regional rotation g3 mobs, prone cPA L2-L5 G3  PATIENT EDUCATION:  06/12/2023 Education details: HEP, POC Person educated: Patient Education method: Programmer, multimedia, Demonstration, Verbal cues, and Handouts Education comprehension: verbalized understanding, returned demonstration, and verbal cues required  HOME EXERCISE PROGRAM: Access Code: [email or web address] URL: [email or web address] Date: 06/12/2023 Prepared by: Chyrel Masson  Exercises - Supine Lower Trunk Rotation  - 2-3 x daily - 7 x weekly - 1 sets - 3-5 reps - 15 hold - Sidelying Lumbar Rotation Stretch  - 2-3 x daily - 7 x weekly - 1 sets - 3-5 reps - 15 hold - Supine 90/90 Sciatic Nerve Glide with Knee Flexion/Extension  - 1-2 x daily - 7 x weekly - 1-2 sets - 10 reps - Standing Lumbar Extension with Counter  - 2-3 x daily - 7 x weekly - 1 sets - 5 reps - Supine Piriformis Stretch with Foot on Ground  - 2-3 x daily - 7 x weekly - 1 sets - 5 reps - 30 hold  ASSESSMENT:  CLINICAL IMPRESSION: Patient is a 53 y.o. who comes to clinic with complaints of back pain with mobility, strength and movement coordination deficits that impair their ability to perform usual daily and recreational functional activities without increase difficulty/symptoms at this time.  Patient to benefit from skilled PT services to address impairments and limitations to improve to previous level of function without restriction secondary to  condition.   OBJECTIVE IMPAIRMENTS: decreased activity tolerance, decreased coordination, decreased endurance, decreased mobility, decreased ROM, hypomobility, increased fascial  restrictions, impaired perceived functional ability, impaired flexibility, improper body mechanics, postural dysfunction, and pain.   ACTIVITY LIMITATIONS: carrying, lifting, bending, sitting, standing, sleeping, and bed mobility  PARTICIPATION LIMITATIONS: interpersonal relationship, community activity, and occupation  PERSONAL FACTORS: Past/current experiences, Time since onset of injury/illness/exacerbation, and previous lumbar microdiscectomy 2014, GERD  are also affecting patient's functional outcome.   REHAB POTENTIAL: Good  CLINICAL DECISION MAKING: Stable/uncomplicated  EVALUATION COMPLEXITY: Low   GOALS: Goals reviewed with patient? Yes  SHORT TERM GOALS: (target date for Short term goals are 3 weeks 07/03/2023)  1. Patient will demonstrate independent use of home exercise program to maintain progress from in clinic treatments.  Goal status: New  LONG TERM GOALS: (target dates for all long term goals are 10 weeks  08/21/2023 )   1. Patient will demonstrate/report pain at worst less than or equal to 2/10 to facilitate minimal limitation in daily activity secondary to pain symptoms.  Goal status: New   2. Patient will demonstrate independent use of home exercise program to facilitate ability to maintain/progress functional gains from skilled physical therapy services.  Goal status: New   3. Patient will demonstrate Patient specific functional scale avg > or = 8/1 to indicate reduced disability due to condition.   Goal status: New   4. Patient will demonstrate lumbar extension 100 % WFL s symptoms to facilitate upright standing, walking posture at PLOF s limitation.  Goal status: New   5.  Patient will demonstrate lateral flexion Rt to knee joint without pain symptoms for usual mobility.   Goal status: New   6.  Patient will demonstrate/report ability to sleep s restriction.  Goal status: New     PLAN:  PT FREQUENCY: 1-2x/week  PT DURATION: 10 weeks  PLANNED  INTERVENTIONS: Can include 09811- PT Re-evaluation, 97110-Therapeutic exercises, 97530- Therapeutic activity, 97112- Neuromuscular re-education, [phone]- Self Care, 97140- Manual therapy, [phone]- Gait training, [phone]- Orthotic Fit/training, [phone]- Canalith repositioning, [id number]- Aquatic Therapy, [phone]- Electrical stimulation (unattended), 97750 Physical performance testing, [id number]- Electrical stimulation (manual), 97016- Vasopneumatic device, [id number]- Ultrasound, [id number]- Traction (mechanical), [id number]- Ionotophoresis 4mg /ml Dexamethasone, Patient/Family education, Balance training, Stair training, Taping, Dry Needling, Joint mobilization, Joint manipulation, Spinal manipulation, Spinal mobilization, Scar mobilization, Vestibular training, Visual/preceptual remediation/compensation, DME instructions, Cryotherapy, and Moist heat.  All performed as medically necessary.  All included unless contraindicated  PLAN FOR NEXT SESSION: Review HEP knowledge/results.   Manual therapy for lumbar mobility.    Chyrel Masson, PT, DPT, OCS, ATC 06/12/23  1:53 PM

## 2023-06-18 ENCOUNTER — Ambulatory Visit (INDEPENDENT_AMBULATORY_CARE_PROVIDER_SITE_OTHER): Admitting: Rehabilitative and Restorative Service Providers"

## 2023-06-18 ENCOUNTER — Encounter: Payer: Self-pay | Admitting: Rehabilitative and Restorative Service Providers"

## 2023-06-18 DIAGNOSIS — M5459 Other low back pain: Secondary | ICD-10-CM

## 2023-06-18 DIAGNOSIS — R293 Abnormal posture: Secondary | ICD-10-CM | POA: Diagnosis not present

## 2023-06-18 DIAGNOSIS — M79604 Pain in right leg: Secondary | ICD-10-CM | POA: Diagnosis not present

## 2023-06-18 NOTE — Therapy (Signed)
 OUTPATIENT PHYSICAL THERAPY TREATMENT   Patient Name: KARION CUDD MRN: [id number] DOB:05/30/1970, 53 y.o., male Today's Date: 06/18/2023  END OF SESSION:  PT End of Session - 06/18/23 0844     Visit Number 2    Number of Visits 20    Date for PT Re-Evaluation 08/21/23    Authorization Type [employer] $10 copay    Progress Note Due on Visit 10    PT Start Time 0844    PT Stop Time 0922    PT Time Calculation (min) 38 min    Activity Tolerance Patient tolerated treatment well    Behavior During Therapy [facility] for tasks assessed/performed              Past Medical History:  Diagnosis Date   Chronic back pain    HNP   COVID-19    GERD (gastroesophageal reflux disease)    Heart murmur    Pneumonia    hx of;couple of yrs ago   Pulmonary valve stenosis 08/24/2016   Echo 6/19: EF 60-65, normal diastolic function, mild LAE, mild rVE, mild RAE, mild pulmonic stenosis (mean 17)   Past Surgical History:  Procedure Laterality Date   ESOPHAGOGASTRODUODENOSCOPY     EXTENSOR TENDON OF FOREARM / WRIST REPAIR     LUMBAR LAMINECTOMY N/A 10/29/2012   Procedure: RIGHT L5-S1 MICRODISCECTOMY;  Surgeon: Eldred Manges, MD;  Location: [facility] OR;  Service: Orthopedics;  Laterality: N/A;   Patient Active Problem List   Diagnosis Date Noted   Pulmonary valve stenosis 08/24/2016   HNP (herniated nucleus pulposus), lumbar 10/29/2012    Class: Diagnosis of    PCP: Windle Guard MD  REFERRING PROVIDER: Eldred Manges, MD  REFERRING DIAG: M54.50 (ICD-10-CM) - Acute right-sided low back pain, unspecified whether sciatica present  Rationale for Evaluation and Treatment: Rehabilitation  THERAPY DIAG:  Other low back pain  Pain in right leg  Abnormal posture  ONSET DATE: 05/2023  SUBJECTIVE:                                                                                                                                                                                           SUBJECTIVE  STATEMENT: Reported Saturday having complaints in Rt leg with tightness/hurting. Pt indicated no pain during activity.  Felt it later.   Pt indicated several days after visit showed some improvement.    Pt indicated tightness in Rt leg back of thigh.    PERTINENT HISTORY:  Previous microdiscectomy L5/S1 Rt in 2014.   PAIN:  NPRS scale: current 4/10.  On Saturday 7/10.   Pain location: back on Rt thigh Pain  description: can be sharp, dull constant at times.  Aggravating factors: Static positioning sitting/standing/lying down  Relieving factors: moving around, steroid, walking  PRECAUTIONS: None  WEIGHT BEARING RESTRICTIONS: No  FALLS:  Has patient fallen in last 6 months? No  LIVING ENVIRONMENT:  Lives in: House/apartment  OCCUPATION: Theatre stage manager  PLOF: Independent, walking, hunt, fish, kayak  PATIENT GOALS: Reduce pain   OBJECTIVE:   IMAGING: XR Lumbar Spine 2-3 Views Result Date: 05/28/2023 AP lateral lumbar images are obtained and reviewed.  Right L5 laminotomy defect noted.  Mild L5-S1 disc space narrowing no listhesis. Impression: Lumbar images without acute changes.  Mild L5-S1 disc space narrowing  PATIENT SURVEYS:   Patient-Specific Activity Scoring Scheme  "0" represents "unable to perform." "10" represents "able to perform at prior level. 0 1 2 3 4 5 6 7 8 9  10 (Date and Score)   Activity 06/12/2023    1. Sleeping/lying   6    2. Sitting > 1 hr  5    3.     4.    5.    Score 5.5    Total score = sum of the activity scores/number of activities Minimum detectable change (90%CI) for average score = 2 points Minimum detectable change (90%CI) for single activity score = 3 points  SCREENING FOR RED FLAGS: 06/12/2023 Bowel or bladder incontinence: No Cauda equina syndrome: No  COGNITION: 06/12/2023 Overall cognitive status: WFL normal      SENSATION: 06/12/2023 [facility]  MUSCLE LENGTH: 06/18/2023:   Passive hamstring SLR in supine Lt: 78 deg       Rt:   62 deg  06/12/2023 Passive hamstring SLR in supine 45 deg bilaterally with general tightness noted.   POSTURE:  06/12/2023 Mild reduced lumbar lordosis. No observed lateral shift.   PALPATION: 06/12/2023 No specific tenderness to touch.   cPA lumbar limited in motion and painful noted in testing.   LUMBAR ROM:  06/12/2023 Directional Preference Assessment: Centralization: none observed Peripheralization: none observed  AROM 06/12/2023 06/18/2023  Flexion Pulling in back,, movement to ankle To mid shin with no pain increase.   Extension 75 % WFl 75% WFL with end range pain Rt lumbar  Repeated x 5 in standing : movement improved to 100 % with similar end range pain  Right lateral flexion Pain in Rt lumbar, limited to mid thigh    Left lateral flexion WFL   Right rotation    Left rotation     (Blank rows = not tested)  LOWER EXTREMITY ROM:      Right 06/12/2023 Left 06/12/2023  Hip flexion    Hip extension    Hip abduction    Hip adduction    Hip internal rotation    Hip external rotation    Knee flexion    Knee extension    Ankle dorsiflexion    Ankle plantarflexion    Ankle inversion    Ankle eversion     (Blank rows = not tested)  LOWER EXTREMITY MMT:    MMT Right 06/12/2023 Left 06/12/2023  Hip flexion 5/5 5/5  Hip extension 5/5 5/5  Hip abduction 5/5   Hip adduction    Hip internal rotation    Hip external rotation    Knee flexion 5/5 5/5  Knee extension 5/5 5/5  Ankle dorsiflexion 5/5 5/5  Ankle plantarflexion    Ankle inversion    Ankle eversion     (Blank rows = not tested)  SPECIAL TESTS:  06/12/2023 (+) Slump for  distal calf pulling on Rt, (-) on Lt.  (-) crossed SLR bilaterally   FUNCTIONAL TESTS:  06/12/2023 18 inch chair s UE assist on 1st try.   GAIT: 06/12/2023 Independent ambulation s deviation.                                                                                                                                                                                                                     TODAY'S TREATMENT:                                                                                                         DATE: 06/18/2023  Therex: Standing lumbar extension AROM x 5 Supine sciatic nerve flossing Rt leg x 10  Supine lumbar trunk rotation 15 sec x 3 bilateral  Supine figure 4 pull towards 15 sec x 3 bilateral  Supine Rt leg isometric press into table 10 sec x 6 (added to home).  Verbal review of existing HEP.     Manual Percussive device STM assist to Rt lumbar paraspinals/QL and Rt glutes.  Manual sciatic nerve flossing Rt leg in hooklying supine.  Hamstring contract/relax techniques in SLR.   TODAY'S TREATMENT:                                                                                                         DATE: 06/12/2023  Therex:    HEP instruction/performance c cues for techniques, handout provided.  Trial set performed of each for comprehension and symptom assessment.  See below for exercise list  Manual Lt sidelying regional rotation g3 mobs, prone cPA L2-L5 G3  PATIENT  EDUCATION:  06/12/2023 Education details: HEP, POC Person educated: Patient Education method: Programmer, multimedia, Demonstration, Verbal cues, and Handouts Education comprehension: verbalized understanding, returned demonstration, and verbal cues required  HOME EXERCISE PROGRAM: Access Code: [email or web address] URL: [email or web address] Date: 06/12/2023 Prepared by: Chyrel Masson  Exercises - Supine Lower Trunk Rotation  - 2-3 x daily - 7 x weekly - 1 sets - 3-5 reps - 15 hold - Sidelying Lumbar Rotation Stretch  - 2-3 x daily - 7 x weekly - 1 sets - 3-5 reps - 15 hold - Supine 90/90 Sciatic Nerve Glide with Knee Flexion/Extension  - 1-2 x daily - 7 x weekly - 1-2 sets - 10 reps - Standing Lumbar Extension with Counter  - 2-3 x daily - 7 x weekly - 1 sets - 5 reps - Supine Piriformis Stretch with Foot on Ground   - 2-3 x daily - 7 x weekly - 1 sets - 5 reps - 30 hold  ASSESSMENT:  CLINICAL IMPRESSION: Review of HEP did not worsen symptoms when done to tightness, not any pain increase.   SLR passive improved some as well as lumbar mobility.  Continued skilled PT services indicated at this time for continued improvements.  Encouraged HEP when symptoms were noted to try to help reduce.  Discussed use of percussive device as well.   OBJECTIVE IMPAIRMENTS: decreased activity tolerance, decreased coordination, decreased endurance, decreased mobility, decreased ROM, hypomobility, increased fascial restrictions, impaired perceived functional ability, impaired flexibility, improper body mechanics, postural dysfunction, and pain.   ACTIVITY LIMITATIONS: carrying, lifting, bending, sitting, standing, sleeping, and bed mobility  PARTICIPATION LIMITATIONS: interpersonal relationship, community activity, and occupation  PERSONAL FACTORS: Past/current experiences, Time since onset of injury/illness/exacerbation, and previous lumbar microdiscectomy 2014, GERD  are also affecting patient's functional outcome.   REHAB POTENTIAL: Good  CLINICAL DECISION MAKING: Stable/uncomplicated  EVALUATION COMPLEXITY: Low   GOALS: Goals reviewed with patient? Yes  SHORT TERM GOALS: (target date for Short term goals are 3 weeks 07/03/2023)  1. Patient will demonstrate independent use of home exercise program to maintain progress from in clinic treatments.  Goal status: on going 06/18/2023  LONG TERM GOALS: (target dates for all long term goals are 10 weeks  08/21/2023 )   1. Patient will demonstrate/report pain at worst less than or equal to 2/10 to facilitate minimal limitation in daily activity secondary to pain symptoms.  Goal status: New   2. Patient will demonstrate independent use of home exercise program to facilitate ability to maintain/progress functional gains from skilled physical therapy services.  Goal  status: New   3. Patient will demonstrate Patient specific functional scale avg > or = 8/1 to indicate reduced disability due to condition.   Goal status: New   4. Patient will demonstrate lumbar extension 100 % WFL s symptoms to facilitate upright standing, walking posture at PLOF s limitation.  Goal status: New   5.  Patient will demonstrate lateral flexion Rt to knee joint without pain symptoms for usual mobility.   Goal status: New   6.  Patient will demonstrate/report ability to sleep s restriction.  Goal status: New     PLAN:  PT FREQUENCY: 1-2x/week  PT DURATION: 10 weeks  PLANNED INTERVENTIONS: Can include 40981- PT Re-evaluation, 97110-Therapeutic exercises, 97530- Therapeutic activity, [id number]- Neuromuscular re-education, 97535- Self Care, 97140- Manual therapy, [id number]- Gait training, [phone]- Orthotic Fit/training, [phone]- Canalith repositioning, [id number]- Aquatic Therapy, [phone]- Electrical stimulation (unattended), [id number] Physical performance testing, [id number]- Electrical stimulation (manual), [id number]- Vasopneumatic device, [id number]- Ultrasound, [id number]- Traction (  mechanical), 13244- Ionotophoresis 4mg /ml Dexamethasone, Patient/Family education, Balance training, Stair training, Taping, Dry Needling, Joint mobilization, Joint manipulation, Spinal manipulation, Spinal mobilization, Scar mobilization, Vestibular training, Visual/preceptual remediation/compensation, DME instructions, Cryotherapy, and Moist heat.  All performed as medically necessary.  All included unless contraindicated  PLAN FOR NEXT SESSION: How was percussive device ?    Chyrel Masson, PT, DPT, OCS, ATC 06/18/23  9:39 AM

## 2023-06-27 ENCOUNTER — Ambulatory Visit (INDEPENDENT_AMBULATORY_CARE_PROVIDER_SITE_OTHER): Admitting: Rehabilitative and Restorative Service Providers"

## 2023-06-27 ENCOUNTER — Encounter: Payer: Self-pay | Admitting: Rehabilitative and Restorative Service Providers"

## 2023-06-27 DIAGNOSIS — M5459 Other low back pain: Secondary | ICD-10-CM | POA: Diagnosis not present

## 2023-06-27 DIAGNOSIS — R293 Abnormal posture: Secondary | ICD-10-CM | POA: Diagnosis not present

## 2023-06-27 DIAGNOSIS — M79604 Pain in right leg: Secondary | ICD-10-CM | POA: Diagnosis not present

## 2023-06-27 NOTE — Therapy (Signed)
 OUTPATIENT PHYSICAL THERAPY TREATMENT   Patient Name: Matthew Lutz MRN: [id number] DOB:Aug 08, 1970, 53 y.o., male Today's Date: 06/27/2023  END OF SESSION:  PT End of Session - 06/27/23 0835     Visit Number 3    Number of Visits 20    Date for PT Re-Evaluation 08/21/23    Authorization Type [employer] $10 copay    Progress Note Due on Visit 10    PT Start Time 0831    PT Stop Time 0909    PT Time Calculation (min) 38 min    Activity Tolerance Patient tolerated treatment well    Behavior During Therapy [facility] for tasks assessed/performed               Past Medical History:  Diagnosis Date   Chronic back pain    HNP   COVID-19    GERD (gastroesophageal reflux disease)    Heart murmur    Pneumonia    hx of;couple of yrs ago   Pulmonary valve stenosis 08/24/2016   Echo 6/19: EF 60-65, normal diastolic function, mild LAE, mild rVE, mild RAE, mild pulmonic stenosis (mean 17)   Past Surgical History:  Procedure Laterality Date   ESOPHAGOGASTRODUODENOSCOPY     EXTENSOR TENDON OF FOREARM / WRIST REPAIR     LUMBAR LAMINECTOMY N/A 10/29/2012   Procedure: RIGHT L5-S1 MICRODISCECTOMY;  Surgeon: Eldred Manges, MD;  Location: [facility] OR;  Service: Orthopedics;  Laterality: N/A;   Patient Active Problem List   Diagnosis Date Noted   Pulmonary valve stenosis 08/24/2016   HNP (herniated nucleus pulposus), lumbar 10/29/2012    Class: Diagnosis of    PCP: Windle Guard MD  REFERRING PROVIDER: Eldred Manges, MD  REFERRING DIAG: M54.50 (ICD-10-CM) - Acute right-sided low back pain, unspecified whether sciatica present  Rationale for Evaluation and Treatment: Rehabilitation  THERAPY DIAG:  Other low back pain  Pain in right leg  Abnormal posture  ONSET DATE: 05/2023  SUBJECTIVE:                                                                                                                                                                                           SUBJECTIVE  STATEMENT: Pt indicated feeing some better.  Pt indicated recently doing a lot of training that wasn't worse.  Pt indicated having complaints into Rt leg that is present. Pt indicated feeling symptoms similar to past symptoms with tingling/numbness down leg.   Pt indicated walking seems to worsening tingling.   Pt indicated initial referral pain was better.     PERTINENT HISTORY:  Previous microdiscectomy L5/S1 Rt in 2014.   PAIN:  NPRS scale: at worst for pain up to 4/10.   Pain location: back on Rt thigh Pain description: can be sharp, dull constant at times.  Aggravating factors: Static positioning sitting/standing/lying down  Relieving factors: moving around, steroid, walking  PRECAUTIONS: None  WEIGHT BEARING RESTRICTIONS: No  FALLS:  Has patient fallen in last 6 months? No  LIVING ENVIRONMENT:  Lives in: House/apartment  OCCUPATION: Theatre stage manager  PLOF: Independent, walking, hunt, fish, kayak  PATIENT GOALS: Reduce pain   OBJECTIVE:   IMAGING: XR Lumbar Spine 2-3 Views Result Date: 05/28/2023 AP lateral lumbar images are obtained and reviewed.  Right L5 laminotomy defect noted.  Mild L5-S1 disc space narrowing no listhesis. Impression: Lumbar images without acute changes.  Mild L5-S1 disc space narrowing  PATIENT SURVEYS:   Patient-Specific Activity Scoring Scheme  "0" represents "unable to perform." "10" represents "able to perform at prior level. 0 1 2 3 4 5 6 7 8 9  10 (Date and Score)   Activity 06/12/2023    1. Sleeping/lying   6    2. Sitting > 1 hr  5    3.     4.    5.    Score 5.5    Total score = sum of the activity scores/number of activities Minimum detectable change (90%CI) for average score = 2 points Minimum detectable change (90%CI) for single activity score = 3 points  SCREENING FOR RED FLAGS: 06/12/2023 Bowel or bladder incontinence: No Cauda equina syndrome: No  COGNITION: 06/12/2023 Overall cognitive status: WFL  normal      SENSATION: 06/12/2023 [facility]  MUSCLE LENGTH: 06/27/2023:  Passive hamstring SLR in supine   Rt:  75 deg   06/18/2023:   Passive hamstring SLR in supine Lt: 78 deg      Rt:   62 deg  06/12/2023 Passive hamstring SLR in supine 45 deg bilaterally with general tightness noted.   POSTURE:  06/12/2023 Mild reduced lumbar lordosis. No observed lateral shift.   PALPATION: 06/12/2023 No specific tenderness to touch.   cPA lumbar limited in motion and painful noted in testing.   LUMBAR ROM:  06/27/2023: Directional Preference Assessment: Centralization: none observed Peripheralization: none observed   06/12/2023 Directional Preference Assessment: Centralization: none observed Peripheralization: none observed  AROM 06/12/2023 06/18/2023 06/27/2023  Flexion Pulling in back,, movement to ankle To mid shin with no pain increase.  To mid shin without tightness bilateral hamstring Rt > Lt  Extension 75 % WFl 75% WFL with end range pain Rt lumbar  Repeated x 5 in standing : movement improved to 100 % with similar end range pain 100 % WFL    Right lateral flexion Pain in Rt lumbar, limited to mid thigh     Left lateral flexion WFL    Right rotation     Left rotation      (Blank rows = not tested)  LOWER EXTREMITY ROM:      Right 06/12/2023 Left 06/12/2023  Hip flexion    Hip extension    Hip abduction    Hip adduction    Hip internal rotation    Hip external rotation    Knee flexion    Knee extension    Ankle dorsiflexion    Ankle plantarflexion    Ankle inversion    Ankle eversion     (Blank rows = not tested)  LOWER EXTREMITY MMT:    MMT Right 06/12/2023 Left 06/12/2023  Hip flexion 5/5 5/5  Hip extension 5/5 5/5  Hip  abduction 5/5   Hip adduction    Hip internal rotation    Hip external rotation    Knee flexion 5/5 5/5  Knee extension 5/5 5/5  Ankle dorsiflexion 5/5 5/5  Ankle plantarflexion    Ankle inversion    Ankle eversion     (Blank rows = not  tested)  SPECIAL TESTS:  06/27/2023: Slump on Rt was more hamstring tightness but no change in tingling.   06/12/2023 (+) Slump for distal calf pulling on Rt, (-) on Lt.  (-) crossed SLR bilaterally   FUNCTIONAL TESTS:  06/12/2023 18 inch chair s UE assist on 1st try.   GAIT: 06/12/2023 Independent ambulation s deviation.                                                                                                                                                                                                                    TODAY'S TREATMENT:                                                                                                         DATE: 06/27/2023  Therex: Standing lumbar extension AROM x 5 Supine hamstring stretch 30 sec x 3 bilateral  Supine sciatic nerve flossing Rt leg x 15  Supine lumbar trunk rotation 15 sec x 3 bilateral  Supine figure 4 pull towards 30 sec x 3 bilateral  Supine Rt leg isometric press into table 10 sec x 10  Lateral shift with wall at Lt 2 sec hold x 10   Verbal review of existing HEP.     Manual Percussive device STM assist to Rt lumbar paraspinals/QL and Rt glutes.    TODAY'S TREATMENT:  DATE: 06/18/2023  Therex: Standing lumbar extension AROM x 5 Supine sciatic nerve flossing Rt leg x 10  Supine lumbar trunk rotation 15 sec x 3 bilateral  Supine figure 4 pull towards 15 sec x 3 bilateral  Supine Rt leg isometric press into table 10 sec x 6 (added to home).  Verbal review of existing HEP.     Manual Percussive device STM assist to Rt lumbar paraspinals/QL and Rt glutes.  Manual sciatic nerve flossing Rt leg in hooklying supine.  Hamstring contract/relax techniques in SLR.   TODAY'S TREATMENT:                                                                                                         DATE: 06/12/2023  Therex:     HEP instruction/performance c cues for techniques, handout provided.  Trial set performed of each for comprehension and symptom assessment.  See below for exercise list  Manual Lt sidelying regional rotation g3 mobs, prone cPA L2-L5 G3  PATIENT EDUCATION:  06/27/2023 Education details: HEP, POC Person educated: Patient Education method: Programmer, multimedia, Demonstration, Verbal cues, and Handouts Education comprehension: verbalized understanding, returned demonstration, and verbal cues required  HOME EXERCISE PROGRAM: Access Code: [email or web address] URL: [email or web address] Date: 06/27/2023 Prepared by: Chyrel Masson  Exercises - Supine Lower Trunk Rotation  - 2-3 x daily - 7 x weekly - 1 sets - 3-5 reps - 15 hold - Sidelying Lumbar Rotation Stretch  - 2-3 x daily - 7 x weekly - 1 sets - 3-5 reps - 15 hold - Supine 90/90 Sciatic Nerve Glide with Knee Flexion/Extension  - 1-2 x daily - 7 x weekly - 1-2 sets - 10 reps - Standing Lumbar Extension with Counter  - 2-3 x daily - 7 x weekly - 1 sets - 5 reps - Supine Piriformis Stretch with Foot on Ground  - 2-3 x daily - 7 x weekly - 1 sets - 5 reps - 30 hold - Supine Hamstring Stretch with Strap  - 2-3 x daily - 7 x weekly - 1 sets - 5 reps - 30 hold - Left Standing Lateral Shift Correction at Wall - Repetitions  - 2-3 x daily - 7 x weekly - 1 sets - 5-10 reps - 3-5 hold  ASSESSMENT:  CLINICAL IMPRESSION: Mobility continued to show improvements.  Symptoms of numbness/tingling that patient indicated was a "chronic normal symptom" was noticed more than chief complaint of pain previously described.  Lateral shift correction for pressure change was performed with relief in movmeent but uncertain of gains post activity.  Added to HEP with instruction for adjustments or cessation pending symptom response.   OBJECTIVE IMPAIRMENTS: decreased activity tolerance, decreased coordination, decreased endurance, decreased mobility, decreased ROM,  hypomobility, increased fascial restrictions, impaired perceived functional ability, impaired flexibility, improper body mechanics, postural dysfunction, and pain.   ACTIVITY LIMITATIONS: carrying, lifting, bending, sitting, standing, sleeping, and bed mobility  PARTICIPATION LIMITATIONS: interpersonal relationship, community activity, and occupation  PERSONAL FACTORS: Past/current experiences, Time since onset of injury/illness/exacerbation, and previous lumbar microdiscectomy 2014, GERD  are also affecting  patient's functional outcome.   REHAB POTENTIAL: Good  CLINICAL DECISION MAKING: Stable/uncomplicated  EVALUATION COMPLEXITY: Low   GOALS: Goals reviewed with patient? Yes  SHORT TERM GOALS: (target date for Short term goals are 3 weeks 07/03/2023)  1. Patient will demonstrate independent use of home exercise program to maintain progress from in clinic treatments.  Goal status: Met  LONG TERM GOALS: (target dates for all long term goals are 10 weeks  08/21/2023 )   1. Patient will demonstrate/report pain at worst less than or equal to 2/10 to facilitate minimal limitation in daily activity secondary to pain symptoms.  Goal status: on going 06/27/2023   2. Patient will demonstrate independent use of home exercise program to facilitate ability to maintain/progress functional gains from skilled physical therapy services.  Goal status: on going 06/27/2023   3. Patient will demonstrate Patient specific functional scale avg > or = 8/1 to indicate reduced disability due to condition.   Goal status: on going 06/27/2023   4. Patient will demonstrate lumbar extension 100 % WFL s symptoms to facilitate upright standing, walking posture at PLOF s limitation.  Goal status: on going 06/27/2023   5.  Patient will demonstrate lateral flexion Rt to knee joint without pain symptoms for usual mobility.   Goal status: on going 06/27/2023   6.  Patient will demonstrate/report ability to sleep s  restriction.  Goal status: on going 06/27/2023     PLAN:  PT FREQUENCY: 1-2x/week  PT DURATION: 10 weeks  PLANNED INTERVENTIONS: Can include 54098- PT Re-evaluation, 97110-Therapeutic exercises, 97530- Therapeutic activity, 97112- Neuromuscular re-education, 97535- Self Care, 97140- Manual therapy, [phone]- Gait training, [phone]- Orthotic Fit/training, [phone]- Canalith repositioning, [id number]- Aquatic Therapy, [phone]- Electrical stimulation (unattended), 97750 Physical performance testing, [id number]- Electrical stimulation (manual), 97016- Vasopneumatic device, [id number]- Ultrasound, [id number]- Traction (mechanical), [id number]- Ionotophoresis 4mg /ml Dexamethasone, Patient/Family education, Balance training, Stair training, Taping, Dry Needling, Joint mobilization, Joint manipulation, Spinal manipulation, Spinal mobilization, Scar mobilization, Vestibular training, Visual/preceptual remediation/compensation, DME instructions, Cryotherapy, and Moist heat.  All performed as medically necessary.  All included unless contraindicated  PLAN FOR NEXT SESSION: check lateral shift exercise use?   Chyrel Masson, PT, DPT, OCS, ATC 06/27/23  9:11 AM

## 2023-07-01 ENCOUNTER — Encounter: Payer: Self-pay | Admitting: Rehabilitative and Restorative Service Providers"

## 2023-07-01 ENCOUNTER — Ambulatory Visit (INDEPENDENT_AMBULATORY_CARE_PROVIDER_SITE_OTHER): Admitting: Rehabilitative and Restorative Service Providers"

## 2023-07-01 DIAGNOSIS — M5459 Other low back pain: Secondary | ICD-10-CM

## 2023-07-01 DIAGNOSIS — M79604 Pain in right leg: Secondary | ICD-10-CM | POA: Diagnosis not present

## 2023-07-01 DIAGNOSIS — R293 Abnormal posture: Secondary | ICD-10-CM | POA: Diagnosis not present

## 2023-07-01 NOTE — Therapy (Addendum)
 OUTPATIENT PHYSICAL THERAPY TREATMENT / DISCHARGE   Patient Name: Matthew Lutz MRN: [id number] DOB:[birth year], 53 y.o., male Today's Date: 07/01/2023  END OF SESSION:  PT End of Session - 07/01/23 0921     Visit Number 4    Number of Visits 20    Date for PT Re-Evaluation 08/21/23    Authorization Type [employer] $10 copay    Progress Note Due on Visit 10    PT Start Time 0920    PT Stop Time 0943    PT Time Calculation (min) 23 min    Activity Tolerance Patient tolerated treatment well    Behavior During Therapy [facility] for tasks assessed/performed                Past Medical History:  Diagnosis Date   Chronic back pain    HNP   COVID-19    GERD (gastroesophageal reflux disease)    Heart murmur    Pneumonia    hx of;couple of yrs ago   Pulmonary valve stenosis 08/24/2016   Echo 6/19: EF 60-65, normal diastolic function, mild LAE, mild rVE, mild RAE, mild pulmonic stenosis (mean 17)   Past Surgical History:  Procedure Laterality Date   ESOPHAGOGASTRODUODENOSCOPY     EXTENSOR TENDON OF FOREARM / WRIST REPAIR     LUMBAR LAMINECTOMY N/A 10/29/2012   Procedure: RIGHT L5-S1 MICRODISCECTOMY;  Surgeon: Oneil JAYSON Herald, MD;  Location: [facility] OR;  Service: Orthopedics;  Laterality: N/A;   Patient Active Problem List   Diagnosis Date Noted   Pulmonary valve stenosis 08/24/2016   HNP (herniated nucleus pulposus), lumbar 10/29/2012    Class: Diagnosis of    PCP: Loring Blush MD  REFERRING PROVIDER: Herald Oneil JAYSON, MD  REFERRING DIAG: M54.50 (ICD-10-CM) - Acute right-sided low back pain, unspecified whether sciatica present  Rationale for Evaluation and Treatment: Rehabilitation  THERAPY DIAG:  Other low back pain  Pain in right leg  Abnormal posture  ONSET DATE: 05/2023  SUBJECTIVE:                                                                                                                                                                                            SUBJECTIVE STATEMENT: Pt indicated feeing some better.  Pt indicated recently doing a lot of training that wasn't worse.  Pt indicated having complaints into Rt leg that is present. Pt indicated feeling symptoms similar to past symptoms with tingling/numbness down leg.   Pt indicated walking seems to worsening tingling.   Pt indicated initial referral pain was better.     PERTINENT HISTORY:  Previous microdiscectomy L5/S1 Rt in 2014.  PAIN:  NPRS scale: at worst 4/10 but less frequent Pain location: back on Rt thigh Pain description: can be sharp, dull constant at times.  Aggravating factors: Static positioning sitting/standing/lying down  Relieving factors: moving around, steroid, walking  PRECAUTIONS: None  WEIGHT BEARING RESTRICTIONS: No  FALLS:  Has patient fallen in last 6 months? No  LIVING ENVIRONMENT:  Lives in: House/apartment  OCCUPATION: Theatre stage manager  PLOF: Independent, walking, hunt, fish, kayak  PATIENT GOALS: Reduce pain   OBJECTIVE:   IMAGING: XR Lumbar Spine 2-3 Views Result Date: 05/28/2023 AP lateral lumbar images are obtained and reviewed.  Right L5 laminotomy defect noted.  Mild L5-S1 disc space narrowing no listhesis. Impression: Lumbar images without acute changes.  Mild L5-S1 disc space narrowing  PATIENT SURVEYS:   Patient-Specific Activity Scoring Scheme  0 represents "unable to perform." 10 represents "able to perform at prior level. 0 1 2 3 4 5 6 7 8 9  10 (Date and Score)   Activity 06/12/2023 07/01/2023   1. Sleeping/lying   6  8  2. Sitting > 1 hr  5  8  3.     4.    5.    Score 5.5 8   Total score = sum of the activity scores/number of activities Minimum detectable change (90%CI) for average score = 2 points Minimum detectable change (90%CI) for single activity score = 3 points  SCREENING FOR RED FLAGS: 06/12/2023 Bowel or bladder incontinence: No Cauda equina syndrome: No  COGNITION: 06/12/2023 Overall cognitive  status: WFL normal      SENSATION: 06/12/2023 [facility]  MUSCLE LENGTH: 06/27/2023:  Passive hamstring SLR in supine   Rt:  75 deg   06/18/2023:   Passive hamstring SLR in supine Lt: 78 deg      Rt:   62 deg  06/12/2023 Passive hamstring SLR in supine 45 deg bilaterally with general tightness noted.   POSTURE:  06/12/2023 Mild reduced lumbar lordosis. No observed lateral shift.   PALPATION: 06/12/2023 No specific tenderness to touch.   cPA lumbar limited in motion and painful noted in testing.   LUMBAR ROM:  06/27/2023: Directional Preference Assessment: Centralization: none observed Peripheralization: none observed   06/12/2023 Directional Preference Assessment: Centralization: none observed Peripheralization: none observed  AROM 06/12/2023 06/18/2023 06/27/2023 07/01/2023  Flexion Pulling in back,, movement to ankle To mid shin with no pain increase.  To mid shin without tightness bilateral hamstring Rt > Lt   Extension 75 % WFl 75% WFL with end range pain Rt lumbar  Repeated x 5 in standing : movement improved to 100 % with similar end range pain 100 % WFL   100 % WFL  Right lateral flexion Pain in Rt lumbar, limited to mid thigh      Left lateral flexion WFL     Right rotation      Left rotation       (Blank rows = not tested)  LOWER EXTREMITY ROM:      Right 06/12/2023 Left 06/12/2023  Hip flexion    Hip extension    Hip abduction    Hip adduction    Hip internal rotation    Hip external rotation    Knee flexion    Knee extension    Ankle dorsiflexion    Ankle plantarflexion    Ankle inversion    Ankle eversion     (Blank rows = not tested)  LOWER EXTREMITY MMT:    MMT Right 06/12/2023 Left 06/12/2023  Hip flexion 5/5  5/5  Hip extension 5/5 5/5  Hip abduction 5/5   Hip adduction    Hip internal rotation    Hip external rotation    Knee flexion 5/5 5/5  Knee extension 5/5 5/5  Ankle dorsiflexion 5/5 5/5  Ankle plantarflexion    Ankle inversion     Ankle eversion     (Blank rows = not tested)  SPECIAL TESTS:  06/27/2023: Slump on Rt was more hamstring tightness but no change in tingling.   06/12/2023 (+) Slump for distal calf pulling on Rt, (-) on Lt.  (-) crossed SLR bilaterally   FUNCTIONAL TESTS:  06/12/2023 18 inch chair s UE assist on 1st try.   GAIT: 06/12/2023 Independent ambulation s deviation.                                                                                                                                                                                                                    TODAY'S TREATMENT:                                                                                                         DATE: 07/01/2023  Therex: Review of existing HEP c verbal cues.  Supine hamstring stretch c strap 30 sec x 3 bilateral  Standing lumbar extension AROM x 5  Lateral shift correction wall at Lt 2 sec hold x 10   Manual Percussive device STM assist to Rt lumbar paraspinals/QL and Rt glutes.  TODAY'S TREATMENT:  DATE: 06/27/2023  Therex: Standing lumbar extension AROM x 5 Supine hamstring stretch 30 sec x 3 bilateral  Supine sciatic nerve flossing Rt leg x 15  Supine lumbar trunk rotation 15 sec x 3 bilateral  Supine figure 4 pull towards 30 sec x 3 bilateral  Supine Rt leg isometric press into table 10 sec x 10  Lateral shift with wall at Lt 2 sec hold x 10   Verbal review of existing HEP.     Manual Percussive device STM assist to Rt lumbar paraspinals/QL and Rt glutes.    TODAY'S TREATMENT:                                                                                                         DATE: 06/18/2023  Therex: Standing lumbar extension AROM x 5 Supine sciatic nerve flossing Rt leg x 10  Supine lumbar trunk rotation 15 sec x 3 bilateral  Supine figure 4 pull towards 15 sec x 3  bilateral  Supine Rt leg isometric press into table 10 sec x 6 (added to home).  Verbal review of existing HEP.     Manual Percussive device STM assist to Rt lumbar paraspinals/QL and Rt glutes.  Manual sciatic nerve flossing Rt leg in hooklying supine.  Hamstring contract/relax techniques in SLR.     PATIENT EDUCATION:  06/27/2023 Education details: HEP, POC Person educated: Patient Education method: Explanation, Demonstration, Verbal cues, and Handouts Education comprehension: verbalized understanding, returned demonstration, and verbal cues required  HOME EXERCISE PROGRAM: Access Code: [email or web address] URL: [email or web address] Date: 06/27/2023 Prepared by: Ozell Silvan  Exercises - Supine Lower Trunk Rotation  - 2-3 x daily - 7 x weekly - 1 sets - 3-5 reps - 15 hold - Sidelying Lumbar Rotation Stretch  - 2-3 x daily - 7 x weekly - 1 sets - 3-5 reps - 15 hold - Supine 90/90 Sciatic Nerve Glide with Knee Flexion/Extension  - 1-2 x daily - 7 x weekly - 1-2 sets - 10 reps - Standing Lumbar Extension with Counter  - 2-3 x daily - 7 x weekly - 1 sets - 5 reps - Supine Piriformis Stretch with Foot on Ground  - 2-3 x daily - 7 x weekly - 1 sets - 5 reps - 30 hold - Supine Hamstring Stretch with Strap  - 2-3 x daily - 7 x weekly - 1 sets - 5 reps - 30 hold - Left Standing Lateral Shift Correction at Wall - Repetitions  - 2-3 x daily - 7 x weekly - 1 sets - 5-10 reps - 3-5 hold  ASSESSMENT:  CLINICAL IMPRESSION: Pt had reported continued improvement in symptoms.  At this time, Pt was confident in HEP.  After discussion today, recommended trial HEP at this time.  Spent time in review of HEP use and percussive device.  Pt demonstrated good recall.   Decreased time spent today due to improvement/presentation.   OBJECTIVE IMPAIRMENTS: decreased activity tolerance, decreased coordination, decreased endurance, decreased mobility, decreased ROM, hypomobility, increased fascial  restrictions, impaired perceived functional ability, impaired flexibility, improper body  mechanics, postural dysfunction, and pain.   ACTIVITY LIMITATIONS: carrying, lifting, bending, sitting, standing, sleeping, and bed mobility  PARTICIPATION LIMITATIONS: interpersonal relationship, community activity, and occupation  PERSONAL FACTORS: Past/current experiences, Time since onset of injury/illness/exacerbation, and previous lumbar microdiscectomy 2014, GERD are also affecting patient's functional outcome.   REHAB POTENTIAL: Good  CLINICAL DECISION MAKING: Stable/uncomplicated  EVALUATION COMPLEXITY: Low   GOALS: Goals reviewed with patient? Yes  SHORT TERM GOALS: (target date for Short term goals are 3 weeks 07/03/2023)  1. Patient will demonstrate independent use of home exercise program to maintain progress from in clinic treatments.  Goal status: Met  LONG TERM GOALS: (target dates for all long term goals are 10 weeks  08/21/2023 )   1. Patient will demonstrate/report pain at worst less than or equal to 2/10 to facilitate minimal limitation in daily activity secondary to pain symptoms.  Goal status: on going 06/27/2023   2. Patient will demonstrate independent use of home exercise program to facilitate ability to maintain/progress functional gains from skilled physical therapy services.  Goal status: on going 06/27/2023   3. Patient will demonstrate Patient specific functional scale avg > or = 8/10 to indicate reduced disability due to condition.   Goal status: Met 07/01/2023   4. Patient will demonstrate lumbar extension 100 % WFL s symptoms to facilitate upright standing, walking posture at PLOF s limitation.  Goal status: Met 07/01/2023   5.  Patient will demonstrate lateral flexion Rt to knee joint without pain symptoms for usual mobility.   Goal status: on going 06/27/2023   6.  Patient will demonstrate/report ability to sleep s restriction.  Goal status:mostly met  07/01/2023     PLAN:  PT FREQUENCY: 1-2x/week  PT DURATION: 10 weeks  PLANNED INTERVENTIONS: Can include 02853- PT Re-evaluation, 97110-Therapeutic exercises, 97530- Therapeutic activity, 97112- Neuromuscular re-education, 97535- Self Care, 97140- Manual therapy, [phone]- Gait training, [phone]- Orthotic Fit/training, [phone]- Canalith repositioning, [id number]- Aquatic Therapy, [phone]- Electrical stimulation (unattended), 97750 Physical performance testing, [phone]- Electrical stimulation (manual), 97016- Vasopneumatic device, [id number]- Ultrasound, [id number]- Traction (mechanical), [id number]- Ionotophoresis 4mg /ml Dexamethasone , Patient/Family education, Balance training, Stair training, Taping, Dry Needling, Joint mobilization, Joint manipulation, Spinal manipulation, Spinal mobilization, Scar mobilization, Vestibular training, Visual/preceptual remediation/compensation, DME instructions, Cryotherapy, and Moist heat.  All performed as medically necessary.  All included unless contraindicated  PLAN FOR NEXT SESSION: HEP trial, discharge after 30 days inactivity.    Ozell Silvan, PT, DPT, OCS, ATC 07/01/23  9:50 AM   PHYSICAL THERAPY DISCHARGE SUMMARY  Visits from Start of Care: 4  Current functional level related to goals / functional outcomes: See note   Remaining deficits: See note   Education / Equipment: HEP  Patient goals were met. Patient is being discharged due to not returning since the last visit.   Ozell Silvan, PT, DPT, OCS, ATC 10/02/23  9:19 AM

## 2023-07-03 ENCOUNTER — Encounter

## 2023-07-11 ENCOUNTER — Ambulatory Visit (AMBULATORY_SURGERY_CENTER)

## 2023-07-11 ENCOUNTER — Telehealth: Payer: Self-pay

## 2023-07-11 VITALS — Ht 70.0 in | Wt 225.0 lb

## 2023-07-11 DIAGNOSIS — Z860100 Personal history of colon polyps, unspecified: Secondary | ICD-10-CM

## 2023-07-11 MED ORDER — NA SULFATE-K SULFATE-MG SULF 17.5-3.13-1.6 GM/177ML PO SOLN: 1.0000 | Freq: Once | ORAL | 0 refills | Status: AC

## 2023-07-11 NOTE — Progress Notes (Signed)
Pre visit completed via phone call; Patient verified name, DOB, and address; No egg or soy allergy known to patient;  No issues known to pt with past sedation with any surgeries or procedures; Patient denies ever being told they had issues or difficulty with intubation;  No FH of Malignant Hyperthermia; Pt is not on diet pills; Pt is not on home 02;  Pt is not on blood thinners;  Pt denies issues with constipation;  No A fib or A flutter; Have any cardiac testing pending--NO Insurance verified during PV appt--- UHC Pt can ambulate without assistance;  Pt denies use of chewing tobacco; Discussed diabetic/weight loss medication holds; Discussed NSAID holds; Checked BMI to be less than 50; Pt instructed to use Singlecare.com or GoodRx for a price reduction on prep;  Patient's chart reviewed by Cathlyn Parsons CNRA prior to previsit and patient appropriate for the [facility].  Pre visit completed and red dot placed by patient's name on their procedure day (on provider's schedule).    Instructions sent to MyChart per patient request;

## 2023-07-11 NOTE — Telephone Encounter (Signed)
 Dr. Norris Cross,  PV completed with patient today; patient reports he is scheduled at [facility] to have a 2D ECHO performed on 07/18/2023;  patient's procedure is scheduled on 07/26/2023 with Dr. Myrtie Neither-  ECHO results will need to be checked on 07/18/2023 or later (prior to procedure) to assess clearance for [facility];  Please review on that date Thank you [name], PV RN  Sending this as an Financial planner and a reminder

## 2023-07-14 NOTE — Telephone Encounter (Signed)
 POD C team,    Please follow up on this as I will be out of the office 4/18 through 4/21  - H. Dominic Friendly, MD

## 2023-07-22 NOTE — Telephone Encounter (Signed)
 Dr Dominic Friendly please review for procedure

## 2023-07-23 ENCOUNTER — Encounter: Payer: Self-pay | Admitting: Certified Registered Nurse Anesthetist

## 2023-07-23 NOTE — Telephone Encounter (Signed)
 [name],  I reviewed this patient's [facility] cardiology note from 07/10/2023 with the following under the assessment and plan  "Asymptomatic heart murmur with no functional limitations. Previous MRI suggests imaging every 2-3 years. - Schedule echocardiogram in two years. - Coordinate with [facility] facility for echocardiogram to be done this year. "   ______________________  Please see which you think, but given that this patient is reportedly asymptomatic from this longstanding valvular disorder, and that the echocardiogram was being done routinely for follow-up of the issue, it sounds to me like this does not preclude him having a procedure in the [facility]. Please advise since the procedure scheduled for later this week.   Memory Staggers MD

## 2023-07-23 NOTE — Telephone Encounter (Signed)
 [name], It appears the patient did complete the ordered ECHO at [facility], however, I am unable to see any results.  Please review patient's chart for these results as patient is scheduled for procedure with Dr. Dominic Friendly on 07/26/2023. Please/thank you [name]

## 2023-07-24 ENCOUNTER — Encounter: Payer: Self-pay | Admitting: Gastroenterology

## 2023-07-25 NOTE — Telephone Encounter (Signed)
 As the patient has already had his PV appt- [facility] procedure still on for 07/26/2023- notation being made for charting purposes;

## 2023-07-26 ENCOUNTER — Encounter: Payer: Self-pay | Admitting: Gastroenterology

## 2023-07-26 ENCOUNTER — Ambulatory Visit (AMBULATORY_SURGERY_CENTER): Admitting: Gastroenterology

## 2023-07-26 VITALS — BP 117/69 | HR 59 | Temp 98.1°F | Resp 10 | Ht 70.0 in | Wt 225.0 lb

## 2023-07-26 DIAGNOSIS — K648 Other hemorrhoids: Secondary | ICD-10-CM | POA: Diagnosis not present

## 2023-07-26 DIAGNOSIS — Z1211 Encounter for screening for malignant neoplasm of colon: Secondary | ICD-10-CM

## 2023-07-26 DIAGNOSIS — K5730 Diverticulosis of large intestine without perforation or abscess without bleeding: Secondary | ICD-10-CM

## 2023-07-26 DIAGNOSIS — D122 Benign neoplasm of ascending colon: Secondary | ICD-10-CM

## 2023-07-26 DIAGNOSIS — D125 Benign neoplasm of sigmoid colon: Secondary | ICD-10-CM

## 2023-07-26 DIAGNOSIS — Z860101 Personal history of adenomatous and serrated colon polyps: Secondary | ICD-10-CM

## 2023-07-26 DIAGNOSIS — Z860100 Personal history of colon polyps, unspecified: Secondary | ICD-10-CM

## 2023-07-26 MED ORDER — SODIUM CHLORIDE 0.9 % IV SOLN: 500.0000 mL | Freq: Once | INTRAVENOUS | Status: DC

## 2023-07-26 NOTE — Progress Notes (Signed)
 History and Physical:  This patient presents for endoscopic testing for: Encounter Diagnosis  Name Primary?   Hx of colonic polyps Yes    Surveillance colonoscopy for Hx TA polyps in 2022 Howard Macho)   Patient is otherwise without complaints or active issues today.   Past Medical History: Past Medical History:  Diagnosis Date   Chronic back pain    HNP   COVID-19    GERD (gastroesophageal reflux disease)    Heart murmur    Pneumonia    hx of;couple of yrs ago   Pulmonary valve stenosis 08/24/2016   Echo 6/19: EF 60-65, normal diastolic function, mild LAE, mild rVE, mild RAE, mild pulmonic stenosis (mean 17)   Sleep apnea    CPAP used nightly     Past Surgical History: Past Surgical History:  Procedure Laterality Date   COLONOSCOPY  2022   DJ-MAC-ulcer/TA x 5 frqag/recall due 09/2023-   ESOPHAGOGASTRODUODENOSCOPY     EXTENSOR TENDON OF FOREARM / WRIST REPAIR     LUMBAR LAMINECTOMY N/A 10/29/2012   Procedure: RIGHT L5-S1 MICRODISCECTOMY;  Surgeon: Adah Acron, MD;  Location: [facility] OR;  Service: Orthopedics;  Laterality: N/A;    Allergies: No Known Allergies  Outpatient Meds: Current Outpatient Medications  Medication Sig Dispense Refill   lisinopril-hydrochlorothiazide (ZESTORETIC) 20-25 MG tablet Take 0.5-1 tablets by mouth at bedtime.     omeprazole (PRILOSEC) 20 MG capsule Take 20 mg by mouth daily.     Current Facility-Administered Medications  Medication Dose Route Frequency Provider Last Rate Last Admin   0.9 %  sodium chloride  infusion  500 mL Intravenous Once Danis, Daphanie Oquendo L III, MD          ___________________________________________________________________ Objective   Exam:  BP (!) 146/91   Pulse 63   Temp 98.1 F (36.7 C) (Temporal)   Resp 11   Ht 5\' 10"  (1.778 m)   Wt 225 lb (102.1 kg)   SpO2 98%   BMI 32.28 kg/m   CV: regular , S1/S2 Resp: clear to auscultation bilaterally, normal RR and effort noted GI: soft, no tenderness, with active  bowel sounds.   Assessment: Encounter Diagnosis  Name Primary?   Hx of colonic polyps Yes     Plan: Colonoscopy   The benefits and risks of the planned procedure(s) were described in detail with the patient or (when appropriate) their health care proxy.  Risks were outlined as including, but not limited to, bleeding, infection, perforation, adverse medication reaction leading to cardiac or pulmonary decompensation, pancreatitis (if ERCP).  The limitation of incomplete mucosal visualization was also discussed.  No guarantees or warranties were given.  The patient is appropriate for an endoscopic procedure in the ambulatory setting.   - Lorella Roles,

## 2023-07-26 NOTE — Op Note (Signed)
 [facility] Patient Name: Matthew Lutz Procedure Date: 07/26/2023 7:21 AM MRN: [id number] Endoscopist: Ace Abu L. Dominic Friendly , MD, [id number] Age: 53 Referring MD:  Date of Birth: 1970/10/01 Gender: Male Account #: [id number] Procedure:                Colonoscopy Indications:              Surveillance: Personal history of adenomatous                            polyps on last colonoscopy 3 years ago                           Three SubCM TA in April 2022 (Dr Howard Macho) Medicines:                Monitored Anesthesia Care Procedure:                Pre-Anesthesia Assessment:                           - Prior to the procedure, a History and Physical                            was performed, and patient medications and                            allergies were reviewed. The patient's tolerance of                            previous anesthesia was also reviewed. The risks                            and benefits of the procedure and the sedation                            options and risks were discussed with the patient.                            All questions were answered, and informed consent                            was obtained. Prior Anticoagulants: The patient has                            taken no anticoagulant or antiplatelet agents. ASA                            Grade Assessment: III - A patient with severe                            systemic disease. After reviewing the risks and                            benefits, the patient was deemed in satisfactory  condition to undergo the procedure.                           After obtaining informed consent, the colonoscope                            was passed under direct vision. Throughout the                            procedure, the patient's blood pressure, pulse, and                            oxygen saturations were monitored continuously. The                            CF [id number] #[id number] was  introduced through the anus                            and advanced to the the cecum, identified by                            appendiceal orifice and ileocecal valve. The                            colonoscopy was performed without difficulty. The                            patient tolerated the procedure well. The quality                            of the bowel preparation was good. The ileocecal                            valve, appendiceal orifice, and rectum were                            photographed. Scope In: 8:08:09 AM Scope Out: 8:23:39 AM Scope Withdrawal Time: 0 hours 13 minutes 21 seconds  Total Procedure Duration: 0 hours 15 minutes 30 seconds  Findings:                 The perianal and digital rectal examinations were                            normal.                           Repeat examination of right colon under NBI                            performed.                           Two sessile polyps were found in the ascending  colon. The polyps were 3 to 6 mm in size. These                            polyps were removed with a cold snare. Resection                            and retrieval were complete.                           Two sessile polyps were found in the sigmoid colon.                            The polyps were diminutive in size. These polyps                            were removed with a cold snare. Resection and                            retrieval were complete.                           Diverticula were found in the left colon.                           Internal hemorrhoids were found.                           The exam was otherwise without abnormality on                            direct and retroflexion views. Complications:            No immediate complications. Estimated Blood Loss:     Estimated blood loss was minimal. Impression:               - Two 3 to 6 mm polyps in the ascending colon,                             removed with a cold snare. Resected and retrieved.                           - Two diminutive polyps in the sigmoid colon,                            removed with a cold snare. Resected and retrieved.                           - Diverticulosis in the left colon.                           - Internal hemorrhoids.                           - The examination was otherwise normal on direct  and retroflexion views. Recommendation:           - Patient has a contact number available for                            emergencies. The signs and symptoms of potential                            delayed complications were discussed with the                            patient. Return to normal activities tomorrow.                            Written discharge instructions were provided to the                            patient.                           - Resume previous diet.                           - Continue present medications.                           - Await pathology results.                           - Repeat colonoscopy is recommended for                            surveillance. The colonoscopy date will be                            determined after pathology results from today's                            exam become available for review. Zakira Ressel L. Dominic Friendly, MD 07/26/2023 8:33:06 AM This report has been signed electronically.

## 2023-07-26 NOTE — Progress Notes (Signed)
 Report given to PACU, vss

## 2023-07-26 NOTE — Progress Notes (Deleted)
 Report given to PACU, vss

## 2023-07-26 NOTE — Progress Notes (Signed)
 Pt's states no medical or surgical changes since previsit or office visit.

## 2023-07-26 NOTE — Progress Notes (Signed)
 Called to room to assist during endoscopic procedure.  Patient ID and intended procedure confirmed with present staff. Received instructions for my participation in the procedure from the performing physician.

## 2023-07-26 NOTE — Patient Instructions (Signed)

## 2023-07-29 ENCOUNTER — Telehealth: Payer: Self-pay

## 2023-07-29 NOTE — Telephone Encounter (Signed)
  Follow up Call-     07/26/2023    7:27 AM  Call back number  Post procedure Call Back phone  # [phone]  Permission to leave phone message Yes     Patient questions:  Do you have a fever, pain , or abdominal swelling? No. Pain Score  0 *  Have you tolerated food without any problems? Yes.    Have you been able to return to your normal activities? Yes.    Do you have any questions about your discharge instructions: Diet   No. Medications  No. Follow up visit  No.  Do you have questions or concerns about your Care? No.  Actions: * If pain score is 4 or above: No action needed, pain <4.

## 2023-07-30 LAB — SURGICAL PATHOLOGY

## 2023-08-01 ENCOUNTER — Encounter: Payer: Self-pay | Admitting: Gastroenterology

## 2024-02-03 ENCOUNTER — Encounter: Payer: Self-pay | Admitting: Radiology

## 2024-05-07 ENCOUNTER — Ambulatory Visit: Admitting: Orthopedic Surgery
